# Patient Record
Sex: Male | Born: 1935 | Race: White | Hispanic: No | Marital: Married | Smoking: Former smoker
Health system: Southern US, Community
[De-identification: ages and names within clinical notes are randomized; demographics above are authoritative.]

## PROBLEM LIST (undated history)

## (undated) DIAGNOSIS — I495 Sick sinus syndrome: Secondary | ICD-10-CM

## (undated) DIAGNOSIS — F329 Major depressive disorder, single episode, unspecified: Secondary | ICD-10-CM

## (undated) DIAGNOSIS — F32A Depression, unspecified: Secondary | ICD-10-CM

## (undated) DIAGNOSIS — E0500 Thyrotoxicosis with diffuse goiter without thyrotoxic crisis or storm: Secondary | ICD-10-CM

## (undated) DIAGNOSIS — I4891 Unspecified atrial fibrillation: Secondary | ICD-10-CM

## (undated) DIAGNOSIS — F419 Anxiety disorder, unspecified: Secondary | ICD-10-CM

## (undated) DIAGNOSIS — I10 Essential (primary) hypertension: Secondary | ICD-10-CM

## (undated) DIAGNOSIS — M1990 Unspecified osteoarthritis, unspecified site: Secondary | ICD-10-CM

## (undated) HISTORY — DX: Sick sinus syndrome: I49.5

## (undated) HISTORY — DX: Major depressive disorder, single episode, unspecified: F32.9

## (undated) HISTORY — DX: Unspecified osteoarthritis, unspecified site: M19.90

## (undated) HISTORY — DX: Depression, unspecified: F32.A

## (undated) HISTORY — DX: Thyrotoxicosis with diffuse goiter without thyrotoxic crisis or storm: E05.00

## (undated) HISTORY — DX: Anxiety disorder, unspecified: F41.9

## (undated) HISTORY — DX: Gilbert syndrome: E80.4

---

## 2000-12-03 ENCOUNTER — Emergency Department (HOSPITAL_COMMUNITY): Admission: EM | Admit: 2000-12-03 | Discharge: 2000-12-04 | Payer: Self-pay | Admitting: Emergency Medicine

## 2000-12-03 ENCOUNTER — Encounter: Payer: Self-pay | Admitting: Emergency Medicine

## 2001-04-09 ENCOUNTER — Ambulatory Visit (HOSPITAL_COMMUNITY): Admission: RE | Admit: 2001-04-09 | Discharge: 2001-04-09 | Payer: Self-pay | Admitting: Ophthalmology

## 2002-06-30 ENCOUNTER — Ambulatory Visit (HOSPITAL_COMMUNITY): Admission: RE | Admit: 2002-06-30 | Discharge: 2002-06-30 | Payer: Self-pay | Admitting: Ophthalmology

## 2003-04-15 ENCOUNTER — Ambulatory Visit (HOSPITAL_COMMUNITY): Admission: RE | Admit: 2003-04-15 | Discharge: 2003-04-15 | Payer: Self-pay | Admitting: Gastroenterology

## 2005-03-31 ENCOUNTER — Ambulatory Visit (HOSPITAL_COMMUNITY): Admission: RE | Admit: 2005-03-31 | Discharge: 2005-03-31 | Payer: Self-pay | Admitting: Neurosurgery

## 2005-09-18 ENCOUNTER — Inpatient Hospital Stay (HOSPITAL_COMMUNITY): Admission: RE | Admit: 2005-09-18 | Discharge: 2005-09-21 | Payer: Self-pay | Admitting: Neurosurgery

## 2005-09-18 ENCOUNTER — Ambulatory Visit: Payer: Self-pay | Admitting: Physical Medicine & Rehabilitation

## 2005-09-21 ENCOUNTER — Inpatient Hospital Stay (HOSPITAL_COMMUNITY)
Admission: RE | Admit: 2005-09-21 | Discharge: 2005-09-26 | Payer: Self-pay | Admitting: Physical Medicine & Rehabilitation

## 2005-09-26 ENCOUNTER — Inpatient Hospital Stay (HOSPITAL_COMMUNITY): Admission: AD | Admit: 2005-09-26 | Discharge: 2005-09-29 | Payer: Self-pay | Admitting: Interventional Cardiology

## 2005-09-26 ENCOUNTER — Encounter (INDEPENDENT_AMBULATORY_CARE_PROVIDER_SITE_OTHER): Payer: Self-pay | Admitting: *Deleted

## 2005-09-29 ENCOUNTER — Inpatient Hospital Stay (HOSPITAL_COMMUNITY)
Admission: RE | Admit: 2005-09-29 | Discharge: 2005-10-06 | Payer: Self-pay | Admitting: Physical Medicine & Rehabilitation

## 2006-04-12 ENCOUNTER — Ambulatory Visit (HOSPITAL_COMMUNITY): Admission: RE | Admit: 2006-04-12 | Discharge: 2006-04-12 | Payer: Self-pay | Admitting: Neurosurgery

## 2006-04-17 ENCOUNTER — Encounter: Admission: RE | Admit: 2006-04-17 | Discharge: 2006-06-25 | Payer: Self-pay | Admitting: Neurosurgery

## 2006-06-26 ENCOUNTER — Encounter: Admission: RE | Admit: 2006-06-26 | Discharge: 2006-07-27 | Payer: Self-pay | Admitting: Neurosurgery

## 2008-05-21 ENCOUNTER — Emergency Department (HOSPITAL_COMMUNITY): Admission: EM | Admit: 2008-05-21 | Discharge: 2008-05-21 | Payer: Self-pay | Admitting: Emergency Medicine

## 2009-10-31 ENCOUNTER — Emergency Department (HOSPITAL_COMMUNITY): Admission: EM | Admit: 2009-10-31 | Discharge: 2009-10-31 | Payer: Self-pay | Admitting: Emergency Medicine

## 2009-11-02 ENCOUNTER — Inpatient Hospital Stay (HOSPITAL_COMMUNITY): Admission: AD | Admit: 2009-11-02 | Discharge: 2009-11-05 | Payer: Self-pay | Admitting: Orthopedic Surgery

## 2011-03-22 LAB — BASIC METABOLIC PANEL
CO2: 31 mEq/L (ref 19–32)
Calcium: 9.4 mg/dL (ref 8.4–10.5)
Creatinine, Ser: 0.78 mg/dL (ref 0.4–1.5)
GFR calc Af Amer: >60 mL/min (ref >60)
GFR calc non Af Amer: >60 mL/min (ref >60)
Glucose, Bld: 120 mg/dL — ABNORMAL HIGH (ref 70–99)
Potassium: 4.2 mEq/L (ref 3.5–5.1)
Sodium: 139 mEq/L (ref 135–145)

## 2011-03-22 LAB — APTT: aPTT: 38 seconds — ABNORMAL HIGH (ref 24–37)

## 2011-05-05 NOTE — Discharge Summary (Signed)
[name] Higgins, MINION NO.:  [id number]   MEDICAL RECORD NO.:  [id number]            PATIENT TYPE:   LOCATION:                                 FACILITY:   PHYSICIAN:  Ranelle Oyster, M.D.     DATE OF BIRTH:   DATE OF ADMISSION:  08/30/2005  DATE OF DISCHARGE:  10/06/2005                                 DISCHARGE SUMMARY   DISCHARGE DIAGNOSES:  1.  Lumbar stenosis L3-S1 with radiculopathy requiring decompression.  2.  Paroxysmal atrial fibrillation, resolved.  3.  Acute blood loss anemia.  Improved by transfusion.   HISTORY OF PRESENT ILLNESS:  Mr. [name] Higgins is an 77 year old male with a history  of myasthenia gravis, low back pain secondary to lumbar stenosis L3-S1 with  spondylosis and radiculopathy and decompressive of L3-L5 with arthrodesis of  L4-5 and L5-S1.  Repeat pedicle screws used, S1 and L4 Gill procedure  October 2, by Dr. Lovell Sheehan.  Postoperatively was noted to have weakness and  decrease in mobility.  He was transferred to rehab for CIR program on  October 5.  On October 10 the patient was noted to be in atrial fibrillation  with RBR.  He was transferred to telemetry by Dr. Garnette Scheuermann for monitoring  and workup.  He was started on IV __________ and IV heparin.  A 2-D echo was  done as part of the workup showing EF of 50-55% with mild AVR and mild focal  basal septal hypertrophy.  The patient reverted to normal sinus rhythm on  Betapace.  On increased doses of Betapace the patient's blood pressures were  noted to be low.  Therefore, dose was adjusted to 18 mg b.i.d. The patient  was maintained on IV heparin and aspirin.  However, he was noted to have  some drop in H&H with hemoglobin 8.9.  Aspirin and heparin discontinued.  As  the patient stabilized out from cardiac standpoint he was transferred back  to rehab to complete his TIR course.   PAST MEDICAL HISTORY:  Significant for myasthenia gravis with a history of  thymectomy, prostate cancer treated  with XRT, osteoporosis, history of rib  fractures and diverticulosis.   ALLERGIES:  To CODEINE.   SOCIAL HISTORY:  The patient is married, was independent and active prior to  admission.  He lives in a 1-level home with 3-steps at entry.  He does not  use any alcohol.  Quit tobacco in 1950s.   HOSPITAL COURSE:  [name] Higgins was readmitted to rehab on September 29, 2005 for inpatient therapies to continue of PT/OT therapy.  On past  admissions heart rate has been normal sinus rhythm on Betapace 80 mg b.i.d.  Blood pressures will be monitored on a b.i.d. basis and initially were noted  to be in the 90s range.  By the time of discharge the blood pressures were  stabilizing out.  In the last 24 hours blood pressures were ranging from 110-  117 systolics, 60-68 diastolic.  Heart rate has been ranging between the 60s  to 70s.  No complaints of  dizziness or lightheadedness reported.  The  patient's back pain has been managed with p.r.n. use of Tylenol.  Back  incision is noted to be healing well without any signs or symptoms of  infection, no drainage, no erythema noted.   Secondary to patient's anemia at admission as well as recent arrhythmia we  elected to go ahead and transfuse the patient with 2 units of packed red  blood cells.  We will check CBCs that we checked on October 14, and October  16 and noted them to be stable and in an upward trend.  Last check of H&H  shows hemoglobin at 11.4.  Hematocrit 33.4.  Check of electrolytes from  October 16 revealed sodium of 136, potassium 4.2, chloride 104, CO2 26, BUN  16, creatinine 0.8, and glucose 93.  LFT shows some slight elevation with  AST of 45 and ALT at 81.  The patient's endurance level has slowly steadily  improved.  He has made good progress during his stay.  By the time of  discharge he was modified independent for ADLs, modified independent for don  and doffing corset without difficulty, modified independence for simple home   management tasks.  He was modified independent for transfers, modified  independent for ambulating greater than 250 feet with a rolling walker.  He  continues with some balance deficits when not used assistive advise.  He is  aware of need for using walker for now.  Further followup therapies include  home health.  PT/OT by [facility] on October 06, 2005 the patient is  discharged to home.   DISCHARGE MEDICATIONS:  1.  Imuran 50 mg three p.o. per day.  2.  Prilosec 20 mg a day.  3.  Betapace 80 mg b.i.d. with stasis.  4.  Eye drops 1 drop OU daily.  5.  Fosamax every Thursday.   ACTIVITY LEVEL:  As tolerated with back precautions and use of walker.  No  driving for 4-6 weeks.  Lifting restrictions.   FOLLOWUP:  The patient to followup with Dr. Lovell Sheehan for postop check.  Followup with Dr. Garnette Scheuermann for recheck on atrial fibrillation in the next  few weeks.  Followup with Dr. Coral Spikes for routine check.      [name] Higgins, P.A.      Ranelle Oyster, M.D.  Electronically Signed    PP/MEDQ  D:  10/06/2005  T:  10/07/2005  Job:  [id number]   cc:   Cristi Loron, M.D.  Fax: [phone]   Garnette Scheuermann, M.D.   Demetria Pore. Coral Spikes, M.D.  Fax: [phone]

## 2011-05-05 NOTE — Discharge Summary (Signed)
NAMEJAVED, COTTO NO.:  [id number]   MEDICAL RECORD NO.:  [id number]          PATIENT TYPE:  INP   LOCATION:  3006                         FACILITY:  [facility]   PHYSICIAN:  Cristi Loron, M.D.DATE OF BIRTH:  Dec 12, 1921   DATE OF ADMISSION:  09/18/2005  DATE OF DISCHARGE:  09/21/2005                                 DISCHARGE SUMMARY   BRIEF HISTORY:  The patient is an 77 year old white male who has suffered  from back pain and with bilateral leg weakness.  He has a history of  myasthenia gravis and workup with a neurologist to include a lumbar MRI as  well as lower extremity NCV EMGs, which were consistent with a lumbar  radiculopathy.  The patient's MRI scan demonstrates lumbar degenerative disk  disease and stenosis.  I discussed the various treatment options with the  patient, including surgery.  The patient and his family weighed the risks  and benefits and alternatives of surgery and decided to proceed with a  lumbar decompression and fusion.   For further details of this admission, please refer to the history and  physical.   HOSPITAL COURSE:  I admitted the patient to [facility] on September 18, 2005, and the day of admission performed an L3 to L5 decompressive  laminectomy as well as a fusion.  The surgery well, and for full details of  this operation please refer to the typed operative note.   Postoperative course:  The patient's postoperative course was unremarkable.  By postop day #3, he was ready for transfer to the inpatient rehab unit and  was transferred to the inpatient rehab unit on September 21, 2005.   FINAL DIAGNOSIS:  L3-4, L4-5, L5-S1 spinal stenosis, lumbar radiculopathy,  lumbar spondylosis, facet arthropathy, lumbar spondylolisthesis, scoliosis.   PROCEDURE PERFORMED:  Decompressive laminectomy, L3, L4, and L5 to treat the  patient's spinal stenosis; L4-5 and L5-S1 posterolateral arthrodesis with  local morcellized autograft  bone and Vitoss bone graft extender; L4 to S1  bilateral segmental instrumentation with pedicle screws and rods; L4 Gill  procedure.   DISCHARGE INSTRUCTIONS:  The patient is instructed to follow up with me four  weeks after discharge from the rehab unit.      Cristi Loron, M.D.  Electronically Signed     JDJ/MEDQ  D:  11/02/2005  T:  11/03/2005  Job:  [id number]

## 2011-05-05 NOTE — Discharge Summary (Signed)
NAMEDIAR, BERKEL NO.:  [id number]   MEDICAL RECORD NO.:  [id number]           PATIENT TYPE:   LOCATION:                                 FACILITY:   PHYSICIAN:  Ranelle Oyster, M.D.     DATE OF BIRTH:   DATE OF ADMISSION:  08/22/2005  DATE OF DISCHARGE:  09/26/2005                                 DISCHARGE SUMMARY   DISCHARGE DIAGNOSES:  1.  New onset atrial fibrillation.  2.  Lumbar decompression for L3 to L5 spondylolisthesis stenosis.  3.  History of myasthenia gravis.  4.  Urinary retention resolving.   HISTORY OF PRESENT ILLNESS:  [name] Higgins is an 77 year old male with history  of myasthenia gravis, prostate cancer, lower extremity weakness with  difficulty ambulation secondary to L3-L4, L4-L5 spondylolisthesis with  stenosis.  Elected to undergo L3 to L5 laminectomy with L5-S1 PLITH by Dr.  Lovell Sheehan September 18, 2005.  Postoperatively has had problems with decreasing  mobility as well as problems with voiding, reporting problems with hesitancy  and incontinence.  Because of the impaired mobility, rehab was consulted for  further therapy.   PAST MEDICAL HISTORY:  See discharge diagnoses.  1.  Past history of thymectomy for myasthenia gravis.  2.  Prostate cancer treated with XRT.  3.  Right foot drop.  4.  Osteoporosis.  5.  DJD right shoulder.  6.  Macular dystrophy and excision of cataracts.   ALLERGIES:  CODEINE.   FAMILY HISTORY:  Positive for CVA and coronary artery disease.   SOCIAL HISTORY:  Patient is married, lives in one level home with three to  four steps at entry.  Was independent with __________ prior to admission and  decrease in mobility.  He quit tobacco 56 years ago.  Has only four-pack-  year history.  Does not use any alcohol.  Patient was independent in driving  prior to admission.   HOSPITAL COURSE:  Mr. [name] Higgins was admitted to rehab on September 21, 2005,  for inpatient therapies to consist of PT and OT daily.  Past  admission, PVRs  were checked to monitor voiding.  Labs done past admission revealed H&H of  10.7 and 31.1, white count 6.6, platelets 163.  Check of lytes revealed  sodium 138, potassium 3.8, chloride 104, CO2 30, BUN 16, creatinine 0.9,  glucose 128.  Patient was noted to  be voiding without difficulty with PVRs  at 0 to 35 mL.  On August 23, 2005, patient reported feelings of malaise  with decrease in energy levels.  He was noted to have a temperature of 101.1  that a.m. and he was pan cultured to include UA/UC including blood cultures  x2 and chest x-ray.  He was started on Cipro empirically for fever of  unknown origin.  Patient's chest x-ray done revealed no active disease.  UA/UC was noted to be negative.  Therefore, Cipro was discontinued on  September 25, 2005.  Patient was noted to be continent of bowel and bladder.  On September 26, 2005, on exam of patient, he was noted to  be tachycardic with  a heart rate from 120 to 130 range.  EKG was done showing question of atrial  fibrillation.  [facility] was consulted for input.  Full set of labs  were done including recheck CBC, a total H&H of 10.3 and 30.3, no  leukocytosis noted with white count at 5.0.  Check of lytes revealed sodium  139, potassium 4.4, chloride 108, CO2 25, BUN 16, creatinine 0.8, glucose  103.  LFTs were noted to be resolving with AST 30, ALT 47, AST 36.  Cardiac  enzymes done showed CK-MB of 3.7 with relative index at 2.9 and troponin I  0.03.  Dr. Katrinka Blazing evaluated patient and felt that patient's EKG revealed  atrial fibrillation with RVR and he hoped for spontaneous conversion.  He  would discuss use of heparin and Coumadin with neurosurgery prior to  initiating this.  Patient was transferred out to telemetry floor for further  work-up and treatment.  On September 26, 2005, [name] Higgins was discharged to  acute services, further adjustments in medications.  Diet and activity level  to be decided by [facility].      Greg Cutter, P.A.      Ranelle Oyster, M.D.  Electronically Signed    PP/MEDQ  D:  12/04/2005  T:  12/06/2005  Job:  [id number]

## 2011-05-05 NOTE — Consult Note (Signed)
NAMEGIOVANNIE, [name] Higgins NO.:  [id number]   MEDICAL RECORD NO.:  [id number]          PATIENT TYPE:  INP   LOCATION:  4729                         FACILITY:  [facility]   PHYSICIAN:  Lyn Records, M.D.   DATE OF BIRTH:  March 31, 1921   DATE OF CONSULTATION:  09/27/2005  DATE OF DISCHARGE:                                   CONSULTATION   REASON FOR CONSULTATION:  Atrial fibrillation with rapid ventricular  response.   CONCLUSION:  1.  Atrial fibrillation with rapid ventricular response.  2.  Recent lumbar disc surgery.  3.  Myasthenia gravis.      1.  Status post thyroidectomy.  4.  Prostate cancer.  5.  Osteoporosis.   RECOMMENDATIONS:  1.  IV Cardizem for rate control.  2.  Anticoagulation.  Will clear with neurosurgery if it is safe to use full      dose of antithrombotic therapy.  3.  Assume that patient has been in atrial fibrillation for less than 48      hours and therefore will probably be able to convert.  Will probably      attempt pharmacologic conversion.  4.  Check cardiac markers and 2-D echocardiogram.   CLINICAL DATA:  The patient is 77 years of age and has multiple medical  problems.  He underwent lumbar laminectomy L3-L5 for spinal stenosis on  September 18, 2005.  He was noted on rounds on September 26, 2005, to have a  rapid heart rate and EKG revealed atrial fibrillation with rapid ventricular  response. The patient was totally devoid of any symptoms related to this.  He denies orthopnea, PND and chest pain.  No prior history of cardiac  problems.  No of atrial fibrillation.   CURRENT MEDICAL REGIMEN:  1.  Darvocet.  2.  Imuran.  3.  Fosamax.  4.  Mirtazapine.   ALLERGIES:  CODEINE.   PHYSICAL EXAMINATION:  GENERAL APPEARANCE:  Patient is in no distress.  He  is lying flat in bed.  VITAL SIGNS:  Blood pressure 104/60, heart rate 140.  EKG reveals atrial  fibrillation with rapid ventricular response, no acute STT wave changes  noted.  NECK:   Neck veins are not distended.  LUNGS:  Clear.  CARDIOVASCULAR:  Rapid irregular rhythm.  ABDOMEN:  Soft.  EXTREMITIES:  No edema.   LABORATORY DATA:  Potassium 3.8, BUN 14, creatinine 0.9.  Hemoglobin greater  than 10.   Chest x-ray on admission was unremarkable.   DISCUSSION:  The patient has probably been in atrial fibrillation for less  than 48 hours.  He has been on subcu Lovenox.  I am uncertain if we can use  full dose anticoagulation.  Will discuss with neurosurgery.  For the time  being, will go with rate control with IV Cardizem.  Will ultimately try  pharmacologic conversion perhaps with Sotalol.      Lyn Records, M.D.  Electronically Signed     HWS/MEDQ  D:  09/27/2005  T:  09/27/2005  Job:  [id number]   cc:   Demetria Pore. Coral Spikes, M.D.  Fax: [phone]   Ranelle Oyster, M.D.  Fax: [phone]   Cristi Loron, M.D.  Fax: [phone]

## 2011-05-05 NOTE — Discharge Summary (Signed)
NAMEQUINTAVIOUS, RINCK NO.:  [id number]   MEDICAL RECORD NO.:  [id number]          PATIENT TYPE:  INP   LOCATION:  3006                         FACILITY:  [facility]   PHYSICIAN:  Cristi Loron, M.D.DATE OF BIRTH:  10/02/1921   DATE OF ADMISSION:  09/18/2005  DATE OF DISCHARGE:  09/21/2005                                 DISCHARGE SUMMARY   BRIEF HISTORY:  Dictation ended at this point.      Cristi Loron, M.D.  Electronically Signed     JDJ/MEDQ  D:  11/02/2005  T:  11/03/2005  Job:  [id number]

## 2011-05-05 NOTE — Op Note (Signed)
[name] Higgins, [name] Higgins                 ACCOUNT NO.:  [id number]   MEDICAL RECORD NO.:  [id number]          PATIENT TYPE:  INP   LOCATION:  2860                         FACILITY:  [facility]   PHYSICIAN:  Cristi Loron, M.D.DATE OF BIRTH:  Jun 02, 1921   DATE OF PROCEDURE:  09/18/2005  DATE OF DISCHARGE:                                 OPERATIVE REPORT   PREOPERATIVE DIAGNOSIS:  L3-4, 4-5, 5-1 spinal stenosis, lumbar  radiculopathy, lumbar spondylosis, facet arthropathy, lumbar  spondylolisthesis and scoliosis.   POSTOPERATIVE DIAGNOSIS:  L3-4, 4-5, 5-1 spinal stenosis, lumbar  radiculopathy, lumbar spondylosis, facet arthropathy, lumbar  spondylolisthesis and scoliosis.   OPERATION PERFORMED:  Decompressive laminectomy at L3, 4, and 5 to treat the  patient's spinal stenosis; L4-5 and L5-S1 posterolateral arthrodesis with  local morcellized autograft bone and Vitoss bone graft extender; L4 to S1  bilateral segmental instrumentation with pedicle screws and rods.  L4 Gill procedure.   SURGEON:  Cristi Loron, M.D.   ASSISTANT:  Hilda Lias, M.D.   ANESTHESIA:  General endotracheal.   ESTIMATED BLOOD LOSS:  300 mL.   SPECIMENS:  None.   DRAINS:  None.   INDICATIONS FOR PROCEDURE:  The patient is an 77 year old white male who has  suffered from back pain and bilateral leg weakness.  He has history of  myasthenia gravis and was worked up by his neurologist to include a lumbar  MRI as well as a lower extremity Renovo EMGs which were consistent with a lumbar  radiculopathy and the MR scan demonstrated lumbar degenerative disk disease  and stenosis.  I discussed the various treatment options with him including  surgery.  The patient and his family have weighed the risks, benefits and  alternatives to surgery and decided to proceed with lumbar decompression and  fusion.   DESCRIPTION OF PROCEDURE:  The patient was brought to the operating room by  the anesthesia team.  General  endotracheal anesthesia was induced.  The  patient was then carefully turned to the prone position on the Wilson frame.  His lumbosacral region was then prepared with Betadine scrub and Betadine  solution and sterile drapes were applied.  I then injected the area to be  incised with Marcaine with epinephrine solution.  I used a scalpel to make a  midline incision over the L3-4, 4-5 and 5-1 interspaces.  I used  electrocautery to perform a bilateral subperiosteal dissection exposing the  spinous process and lamina of L3, 4, 5 and the upper sacrum.  We obtained  intraoperative radiograph to confirm our location.  We then inserted the  Versatrac retractor for exposure.  We used the scalpel to incise the L3-4, 4-  5 and 5-1 interspinous ligament.  We used Leksell rongeur to remove the L4  and L5 spinous processes.  We saved this bone and later cleared it of soft  tissue, morcellized it and used it in the fusion process.  We used a high  speed drill to perform bilateral laminotomies at L5, L4 and L3.  We then  completed the L4 Gill procedure using the Kerrison punch  to remove the  remainder of the L4 lamina and pars and removed the L4-5 ligamentum flavum.  We also used the Kerrison punch to complete the laminectomy at L5 as well as  to widen the laminotomies bilaterally at L3.  We removed the ligamentum  flavum at L3-4, 4-5 and 5-1.  We performed a generous foraminotomy about the  bilateral 4, 5, and S1 nerve roots.  In the process of doing the  decompression, we encountered a midline dorsal durotomy.  We closed this  with a running 6-0 Prolene suture.  We then had anesthesia Valsalva the  patient and there was a good watertight closure.  At this point we had  completed the decompression and of note there was considerable facet  arthropathy, worse on the right than the left but we had a good  decompression of the thecal sac and the bilateral L4, 5, and S1 nerve roots.  We now turned our  attention to the posterior segmental instrumentation.  Under fluoroscopic guidance, we cannulated the bilateral L4, 5, and S1  pedicles with the bone probe. We tapped the pedicles with 5.5 mm tap.  We  probed inside the tapped pedicles to rule out cortical breeches.  We then  inserted a combination of 6.5 x 40, 45 and 50 mm pedicle screws bilaterally  at L4, 5, and S1.  We did this under fluoroscopic guidance.  After we placed  the pedicle screws, we palpated along the medial aspect of the bilateral L4,  5, and S1 pedicles and noted there were no cortical breeches and the  bilateral L4, 5, and S1 nerve roots were not injured.  We then connected the  unilateral pedicle screws with appropriate length rod.  We fastened the rod  by placing the caps which were tightened appropriately and this ended the  instrumentation.   We now turned our attention to posterolateral arthrodesis.  We used high  speed drill to decorticate the remainder of the L4-5 and 5-1 facet joints  and the bilateral L4-5 pars region and the upper sacrum.  We then laid a  combination of local morcellized autograft bone and VITOSS bone graft  extender over these decorticated posterolateral structures completing the  posterolateral arthrodesis.   We then obtained stringent hemostasis using bipolar electrocautery.  We once  again palpated along the ventral surface of the thecal sac and noted it to  be well decompressed.  We did use high speed drill to remove some of the  posterior spondylosis at L4-5 on the right which was causing some ventral  compression of the thecal sac, further decompressing the thecal sac.  We  then copiously irrigated the wound out with bacitracin solution.  We then  placed a seal over the durotomy and then we removed the retractor,  reapproximating the patient's thoracolumbar fascia with interrupted #1 Vicryl suture, the subcutaneous tissue with interrupted 2-0 Vicryl suture  and the skin with  Steri-Strips and benzoin. The wound was then coated with  bacitracin ointment, sterile dressing was applied, the drapes were removed.  The patient was subsequently returned to supine position where he was  extubated by the anesthesia team and transported to the post anesthesia care  unit in stable condition.  All sponge, needle and instrument counts were  correct at the end of the case.      Cristi Loron, M.D.  Electronically Signed     JDJ/MEDQ  D:  09/18/2005  T:  09/19/2005  Job:  [id number]

## 2011-05-05 NOTE — Op Note (Signed)
   [name], [name]                           ACCOUNT NO.:  [id number]   MEDICAL RECORD NO.:  [id number]                   PATIENT TYPE:  AMB   LOCATION:  ENDO                                 [facility]ACILITY[facility]   PHYSICIAN:  Danise Edge, M.D.                DATE OF BIRTH:  1921-05-30   DATE OF PROCEDURE:  04/15/2003  DATE OF DISCHARGE:                                 OPERATIVE REPORT   REFERRING PHYSICIAN:  Demetria Pore. Coral Spikes, M.D.   PROCEDURE PERFORMED:  Colonoscopy.   ENDOSCOPIST:  Charolett Bumpers, M.D.   INDICATIONS FOR PROCEDURE:  The patient is a 77 year old male born Jan 21, 1921.  The patient is scheduled to undergo his first screening  colonoscopy with polypectomy to prevent colon cancer.   PREMEDICATION:  Versed 3 mg, Demerol 50 mg.   DESCRIPTION OF PROCEDURE:  After obtaining informed consent, the patient was  placed in the left lateral decubitus position.  I administered intravenous  Demerol and intravenous Versed to achieve conscious sedation for the  procedure.  The patient's blood pressure, oxygen saturations and cardiac  rhythm were monitored throughout the procedure and documented in the medical  record.   Anal inspection was normal.  Digital rectal exam was normal.  The prostate  was nonnodular.  The pediatric Olympus colonoscope was introduced into the  rectum and easily advanced to the cecum.  Colonic preparation for the exam  today was excellent.   Rectum:  Normal.   Sigmoid colon and descending colon:  Left colonic diverticulosis.   Splenic flexure:  Normal.   Transverse colon:  Normal.   Hepatic flexure:  Normal.   Ascending colon:  Normal.   Cecum and ileocecal valve:  Normal.    ASSESSMENT:  Normal screening proctocolonoscopy to the cecum.  No endoscopic  evidence for the presence of colorectal neoplasia.                                                   Danise Edge, M.D.    MJ/MEDQ  D:  04/15/2003  T:  04/15/2003  Job:   [id number]   cc:   Demetria Pore. Coral Spikes, M.D.  [address]  [address]  [place]  [place] [address]  Fax: [phone]

## 2011-05-05 NOTE — Discharge Summary (Signed)
[name] Higgins, GOSSE NO.:  [id number]   MEDICAL RECORD NO.:  [id number]          PATIENT TYPE:  INP   LOCATION:  4729                         FACILITY:  [facility]   PHYSICIAN:  Lyn Records, M.D.   DATE OF BIRTH:  [birth year]   DATE OF ADMISSION:  09/26/2005  DATE OF DISCHARGE:  09/29/2005                                 DISCHARGE SUMMARY   CHIEF COMPLAINT AND REASON FOR ADMISSION:  [name] Higgins is an 77 year old male  patient with multiple medical problems.  He was admitted to the hospital on  September 18, 2005 for lumbar laminectomy L3-5 spinal stenosis.  On routine  rounds on September 26, 2005 the was found to have rapid heart rate and EKG  revealed atrial fibrillation with rapid ventricular response.  The patient  was asymptomatic with this rhythm and he has no prior history of cardiac  disease or atrial fibrillation.  The patient has been in atrial fibrillation  for less than 48 hours.  He has been on subcutaneous Lovenox with plans per  Dr. Katrinka Blazing to discuss with neurosurgery if this can be bumped up to full dose  Lovenox for anticoagulation for atrial fibrillation.  Initial plans are to  start with IV rate-control with Cardizem and possible pharmacological  cardioversion with Sotalol if okay with neurosurgery postoperatively.   ADMISSION DIAGNOSES:  1.  Atrial fibrillation with rapid ventricular response.  2.  Recent lumbar disc surgery.  3.  Myasthenia gravis.  4.  Prostate cancer.  5.  Osteoporosis.   HOSPITAL COURSE:  Problem 1. Atrial fibrillation with rapid ventricular  response.  The patient started on IV Cardizem with subsequent good control  of ventricular rate.  Cardiac markers were negative.  Hemoglobin stable at  10.  Potassium stable at 4.4.  LFT's were mildly elevated with an ALT of 47  and the alkaline phosphatase was low at 36.  Because the patient's  ventricular rate was beginning to come down Dr. Katrinka Blazing planned on September 27, 2005 to wean and  discontinue the Cardizem and initiate Sotalol once the  heart rate was less than 70.  Plans were to send the patient back to the  rehab unit where he was initially evaluated once Sotalol load was complete.  There was no need for any additional cardiac workup such as an ischemic  workup.   In the afternoon of September 27, 2005 the patient had a typical chest pain  not associated with arrhythmia or EKG changes.  Enzymes were repeated and  these were normal.  Blood pressure was slightly low at 90/60 the following  day.  With the initiation of Sotalol the patient had subsequently converted  to sinus rhythm with PAC's.  He was deemed appropriate to return back to  rehab medicine with the rehab medicine doctor assuming care of the patient.  Dr. Katrinka Blazing wanted to continue aspirin and subcutaneous Lovenox for DVT  prophylaxis as before with discontinue of IV heparin and no need for chronic  Coumadin anticoagulation at this point.  Betapace was decreased to 80 mg  b.i.d.  FINAL DISCHARGE DIAGNOSES:  1.  Atrial fibrillation with rapid ventricular response.  2.  Subsequent conversion to sinus rhythm with PAC's after initiation of      Sotalol therapy.  3.  Short duration of atrial fibrillation, no need for chronic      anticoagulation.  4.  Recent laminectomy surgery.  Okay to return to rehab.   DISCHARGE MEDICATIONS AND PLANS:  Continue as previously noted.  Will  continue the Betapace at 80 mg b.i.d.  Lovenox back to subcu dosing.  Discontinue heparin.  Other medications and management plans per rehab  physician.      Allison L. Rennis Harding, N.P.      Lyn Records, M.D.  Electronically Signed    ALE/MEDQ  D:  11/27/2005  T:  11/28/2005  Job:  [id number]

## 2013-10-15 ENCOUNTER — Encounter: Payer: Self-pay | Admitting: Podiatry

## 2013-10-15 ENCOUNTER — Ambulatory Visit (INDEPENDENT_AMBULATORY_CARE_PROVIDER_SITE_OTHER): Payer: Medicare Other | Admitting: Podiatry

## 2013-10-15 VITALS — BP 138/82 | HR 67 | Resp 16 | Ht 67.5 in | Wt 159.0 lb

## 2013-10-15 DIAGNOSIS — B351 Tinea unguium: Secondary | ICD-10-CM

## 2013-10-15 DIAGNOSIS — M79609 Pain in unspecified limb: Secondary | ICD-10-CM

## 2013-10-15 NOTE — Progress Notes (Signed)
Patient ID: [name] Higgins, male   DOB: 04/26/1921, 79 y.o.   MRN: [id number]  Subjective: This alert orientated x41 79 year old white male presents for ongoing debridement of painful mycotic toenails.  Objective: Hypertrophic elongated toenails are texture and color changes x10  Assessment: Symptomatic onychomycoses x10  Plan: All 10 toenails are debrided back today. Small pinpoint bleeding noted on the left hallux, which was painted with Betadine and a dressing applied. Patient advised to remove the bandage in the next 48 hours and apply topical antibiotic ointment if needed.  Reappoint in 3 month intervals  Richard C.Leeanne Deed, DPM

## 2013-10-15 NOTE — Patient Instructions (Signed)
Remove bandage on the left great toe within 48 hours. You could continue to apply topical antibiotic ointment if needed to the left great toe. Applied all-purpose skin lotion to feet and legs on a daily basis.

## 2013-10-21 ENCOUNTER — Encounter: Payer: Self-pay | Admitting: Podiatry

## 2013-10-21 ENCOUNTER — Ambulatory Visit (INDEPENDENT_AMBULATORY_CARE_PROVIDER_SITE_OTHER): Payer: Medicare Other | Admitting: Podiatry

## 2013-10-21 VITALS — BP 169/69 | HR 86 | Resp 12

## 2013-10-21 DIAGNOSIS — M79609 Pain in unspecified limb: Secondary | ICD-10-CM

## 2013-10-21 NOTE — Progress Notes (Signed)
[name] Higgins presents today questioning whether or not he has an infection to the hallux left. He states that Dr. Cristela Felt at the end of his toe the other day and he sees a red spot weeks this is concerned about it.  Objective: Vital signs are stable he is alert and oriented x3 there is no erythema edema cellulitis drainage or odor to the toes hallux left no signs of infection.  Assessment: no infection hallux left.  Plan: Soak in Epsom salts in warm water for the next few days followup with should he notice any redness or drainage.

## 2013-10-27 ENCOUNTER — Telehealth: Payer: Self-pay | Admitting: *Deleted

## 2013-10-27 NOTE — Telephone Encounter (Signed)
Pt states took the dressing off a little earlier than 48 hrs and now it feels funny.  Pt was unable to describe the sensation, but was scared.  I told him to continue the care Dr Al Corpus had ordered and I would like him to see Dr Al Corpus on 10/28/2013.  I referred him to a scheduler.

## 2013-10-28 ENCOUNTER — Encounter: Payer: Self-pay | Admitting: Podiatry

## 2013-10-28 ENCOUNTER — Ambulatory Visit (INDEPENDENT_AMBULATORY_CARE_PROVIDER_SITE_OTHER): Payer: Medicare Other | Admitting: Podiatry

## 2013-10-28 VITALS — BP 137/79 | HR 74 | Resp 16

## 2013-10-28 DIAGNOSIS — L02619 Cutaneous abscess of unspecified foot: Secondary | ICD-10-CM

## 2013-10-28 NOTE — Progress Notes (Signed)
[name] Higgins presents today for followup of a painful hallux nail left  foot. He denies fever chills nausea vomiting muscle aches or pains.  Objective: Pulses are palpable to the left foot. Hallux nail is thick yellow dystrophic clinically mycotic does not demonstrate any signs of bacterial infection in the surrounding soft tissue.  Assessment: Resolving infection hallux left.  Plan: Followup for routine nail debridement.

## 2013-11-24 ENCOUNTER — Ambulatory Visit: Payer: Medicare Other | Admitting: Podiatry

## 2014-01-19 ENCOUNTER — Ambulatory Visit: Payer: 59 | Admitting: Podiatry

## 2014-02-02 ENCOUNTER — Ambulatory Visit: Payer: 59 | Admitting: Podiatry

## 2014-02-16 ENCOUNTER — Ambulatory Visit (INDEPENDENT_AMBULATORY_CARE_PROVIDER_SITE_OTHER): Payer: Medicare Other | Admitting: Podiatry

## 2014-02-16 ENCOUNTER — Encounter: Payer: Self-pay | Admitting: Podiatry

## 2014-02-16 VITALS — BP 122/66 | HR 79 | Resp 18

## 2014-02-16 DIAGNOSIS — M79609 Pain in unspecified limb: Secondary | ICD-10-CM

## 2014-02-16 DIAGNOSIS — B351 Tinea unguium: Secondary | ICD-10-CM

## 2014-02-16 NOTE — Patient Instructions (Signed)
Leave bandages on first and fourth toes on the right foot x24 hours. Apply triple antibiotic ointment to the toenails 1 and 4 right daily x5 days.

## 2014-02-17 ENCOUNTER — Telehealth: Payer: Self-pay | Admitting: *Deleted

## 2014-02-17 NOTE — Telephone Encounter (Signed)
Pt states his paperwork tells him to take the dressings off his toenail after 24 hours and put triple antibiotic ointment on them for 5 days.  Pt states he can take it off but doesn't have triple antibiotic ointment only and antibiotic ointment, and where does he put it.  I told him on the skin around the toes.  Pt states understanding.

## 2014-02-17 NOTE — Progress Notes (Signed)
Patient ID: [name] Higgins, male   DOB: 17-Sep-1921, 80 y.o.   MRN: [id number]  Subjective: This patient presents for ongoing debridement of painful mycotic toenails.  Objective: Elongated, discolored, hypertrophic, brittle toenails x10.  Assessment: Symptomatic onychomycoses x10  Plan: All 10 toenails were debrided. Small bleeding noted distal worse and fourth right toes. Betadine and gauze dressings applied to both sites. Patient advised to continue applied topical antibiotic ointment to these areas.  Reappoint at three-month intervals or sooner if patient has concern.

## 2014-02-20 ENCOUNTER — Ambulatory Visit: Payer: Medicare Other | Admitting: *Deleted

## 2014-02-20 VITALS — BP 149/95 | HR 74 | Resp 17

## 2014-02-20 DIAGNOSIS — M79606 Pain in leg, unspecified: Secondary | ICD-10-CM

## 2014-02-20 NOTE — Progress Notes (Signed)
Pt present to office after calling to report he thought his toe was swollen after being trimmed on 02/16/2014 by Dr. Leeanne Deeduchman.  I removed pt's right shoe and brace, to view right hallux and toenail borders.  Pt states it does not hurt and has had no drainage.  The right hallux is not swollen, red nor does it have any drainage.  I applied Triple Antibiotic Ointment to the right hallux and instructed pt to dress the area the same way for 7 to 10 days.  Pt states he will do the dressing changes as directed.

## 2014-02-23 ENCOUNTER — Ambulatory Visit: Payer: Medicare Other | Admitting: Podiatry

## 2014-05-18 ENCOUNTER — Encounter: Payer: Self-pay | Admitting: Podiatry

## 2014-05-18 ENCOUNTER — Ambulatory Visit (INDEPENDENT_AMBULATORY_CARE_PROVIDER_SITE_OTHER): Payer: Medicare Other | Admitting: Podiatry

## 2014-05-18 VITALS — BP 135/88 | HR 96 | Resp 18

## 2014-05-18 DIAGNOSIS — M79609 Pain in unspecified limb: Secondary | ICD-10-CM

## 2014-05-18 DIAGNOSIS — B351 Tinea unguium: Secondary | ICD-10-CM

## 2014-05-19 NOTE — Progress Notes (Signed)
Patient ID: [name] Higgins, male   DOB: 10-16-1921, 80 y.o.   MRN: [id number]  Subjective: White male presents requesting debridement of toenails. He is very fearful of having a bleeding as a result of the debridement.  Objective: All toenails are brittle, hypertrophic, incurvated, discolored and tender to palpation  Assessment: Symptomatic onychomycoses x10   Plan: All 10 toenails were debrided mechanically without any bleeding.  Reappoint at three-month intervals

## 2014-08-10 ENCOUNTER — Ambulatory Visit: Payer: Medicare Other | Admitting: Podiatry

## 2014-08-17 ENCOUNTER — Ambulatory Visit (INDEPENDENT_AMBULATORY_CARE_PROVIDER_SITE_OTHER): Payer: Medicare Other | Admitting: Podiatry

## 2014-08-17 DIAGNOSIS — M79676 Pain in unspecified toe(s): Secondary | ICD-10-CM

## 2014-08-17 DIAGNOSIS — B351 Tinea unguium: Secondary | ICD-10-CM

## 2014-08-17 DIAGNOSIS — M79609 Pain in unspecified limb: Secondary | ICD-10-CM

## 2014-08-17 NOTE — Progress Notes (Signed)
Patient ID: [name] Higgins, male   DOB: 1921-08-11, 80 y.o.   MRN: [id number]  Subjective: This patient presents complaining of painful toenails  Objective: Discolored, hypertrophic, incurvated toenails 6-10  Assessment: Symptomatic onychomycoses 6-10  Plan: Nails x10 debrided without a bleeding  Reappoint at three-month intervals

## 2014-09-15 ENCOUNTER — Encounter (HOSPITAL_COMMUNITY): Payer: Self-pay | Admitting: Emergency Medicine

## 2014-09-15 ENCOUNTER — Emergency Department (HOSPITAL_COMMUNITY)
Admission: EM | Admit: 2014-09-15 | Discharge: 2014-09-15 | Disposition: A | Discharge status: Home / Self Care | Payer: Medicare Other | Attending: Emergency Medicine | Admitting: Emergency Medicine

## 2014-09-15 ENCOUNTER — Emergency Department (HOSPITAL_COMMUNITY): Payer: Medicare Other

## 2014-09-15 DIAGNOSIS — Z87891 Personal history of nicotine dependence: Secondary | ICD-10-CM | POA: Diagnosis not present

## 2014-09-15 DIAGNOSIS — S51009A Unspecified open wound of unspecified elbow, initial encounter: Secondary | ICD-10-CM | POA: Insufficient documentation

## 2014-09-15 DIAGNOSIS — Z7982 Long term (current) use of aspirin: Secondary | ICD-10-CM | POA: Insufficient documentation

## 2014-09-15 DIAGNOSIS — Y9389 Activity, other specified: Secondary | ICD-10-CM | POA: Diagnosis not present

## 2014-09-15 DIAGNOSIS — W010XXA Fall on same level from slipping, tripping and stumbling without subsequent striking against object, initial encounter: Secondary | ICD-10-CM | POA: Insufficient documentation

## 2014-09-15 DIAGNOSIS — Z862 Personal history of diseases of the blood and blood-forming organs and certain disorders involving the immune mechanism: Secondary | ICD-10-CM | POA: Diagnosis not present

## 2014-09-15 DIAGNOSIS — S92309A Fracture of unspecified metatarsal bone(s), unspecified foot, initial encounter for closed fracture: Secondary | ICD-10-CM | POA: Diagnosis not present

## 2014-09-15 DIAGNOSIS — M129 Arthropathy, unspecified: Secondary | ICD-10-CM | POA: Insufficient documentation

## 2014-09-15 DIAGNOSIS — S065X9A Traumatic subdural hemorrhage with loss of consciousness of unspecified duration, initial encounter: Secondary | ICD-10-CM | POA: Diagnosis not present

## 2014-09-15 DIAGNOSIS — S0990XA Unspecified injury of head, initial encounter: Secondary | ICD-10-CM | POA: Diagnosis present

## 2014-09-15 DIAGNOSIS — Y9289 Other specified places as the place of occurrence of the external cause: Secondary | ICD-10-CM | POA: Diagnosis not present

## 2014-09-15 DIAGNOSIS — Z23 Encounter for immunization: Secondary | ICD-10-CM | POA: Diagnosis not present

## 2014-09-15 DIAGNOSIS — Z8639 Personal history of other endocrine, nutritional and metabolic disease: Secondary | ICD-10-CM | POA: Diagnosis not present

## 2014-09-15 DIAGNOSIS — S065XAA Traumatic subdural hemorrhage with loss of consciousness status unknown, initial encounter: Secondary | ICD-10-CM | POA: Diagnosis not present

## 2014-09-15 DIAGNOSIS — I10 Essential (primary) hypertension: Secondary | ICD-10-CM | POA: Insufficient documentation

## 2014-09-15 DIAGNOSIS — S92301A Fracture of unspecified metatarsal bone(s), right foot, initial encounter for closed fracture: Secondary | ICD-10-CM

## 2014-09-15 DIAGNOSIS — W19XXXA Unspecified fall, initial encounter: Secondary | ICD-10-CM

## 2014-09-15 DIAGNOSIS — Z79899 Other long term (current) drug therapy: Secondary | ICD-10-CM | POA: Diagnosis not present

## 2014-09-15 HISTORY — DX: Essential (primary) hypertension: I10

## 2014-09-15 HISTORY — DX: Unspecified atrial fibrillation: I48.91

## 2014-09-15 LAB — CBC
HEMATOCRIT: 32.3 % — AB (ref 39.0–52.0)
HEMOGLOBIN: 11 g/dL — AB (ref 13.0–17.0)
MCH: 33.8 pg (ref 26.0–34.0)
MCHC: 34.1 g/dL (ref 30.0–36.0)
MCV: 99.4 fL (ref 78.0–100.0)
Platelets: 151 10*3/uL (ref 150–400)
RBC: 3.25 MIL/uL — AB (ref 4.22–5.81)
RDW: 16.3 % — AB (ref 11.5–15.5)
WBC: 6 10*3/uL (ref 4.0–10.5)

## 2014-09-15 LAB — BASIC METABOLIC PANEL
Anion gap: 14 (ref 5–15)
BUN: 21 mg/dL (ref 6–23)
CO2: 25 mEq/L (ref 19–32)
Calcium: 9.6 mg/dL (ref 8.4–10.5)
Chloride: 95 mEq/L — ABNORMAL LOW (ref 96–112)
Creatinine, Ser: 0.77 mg/dL (ref 0.50–1.35)
GFR calc Af Amer: 90 mL/min — ABNORMAL LOW (ref >90)
GFR calc non Af Amer: 78 mL/min — ABNORMAL LOW (ref >90)
Glucose, Bld: 102 mg/dL — ABNORMAL HIGH (ref 70–99)
Potassium: 4.1 mEq/L (ref 3.7–5.3)
Sodium: 134 mEq/L — ABNORMAL LOW (ref 137–147)

## 2014-09-15 LAB — URINALYSIS, ROUTINE W REFLEX MICROSCOPIC
Bilirubin Urine: NEGATIVE
GLUCOSE, UA: NEGATIVE mg/dL
Ketones, ur: 15 mg/dL — AB
LEUKOCYTES UA: NEGATIVE
NITRITE: NEGATIVE
PH: 5.5 (ref 5.0–8.0)
PROTEIN: 30 mg/dL — AB
Specific Gravity, Urine: 1.017 (ref 1.005–1.030)
UROBILINOGEN UA: 1 mg/dL (ref 0.0–1.0)

## 2014-09-15 LAB — URINE MICROSCOPIC-ADD ON

## 2014-09-15 LAB — I-STAT TROPONIN, ED: Troponin i, poc: 0.06 ng/mL (ref 0.00–0.08)

## 2014-09-15 MED ORDER — TETANUS-DIPHTH-ACELL PERTUSSIS 5-2.5-18.5 LF-MCG/0.5 IM SUSP
0.5000 mL | Freq: Once | INTRAMUSCULAR | Status: AC
  Administered 2014-09-15: 0.5 mL via INTRAMUSCULAR
  Filled 2014-09-15: qty 0.5

## 2014-09-15 NOTE — ED Notes (Signed)
SW at bedside to discuss resources with patient.

## 2014-09-15 NOTE — ED Notes (Signed)
Pt and family sent home with resources per SW. Family has home care set up. No other questions/concerns. Pt sent home with all belongings and bilateral lower leg braces. Assisted patient in wheelchair to car. Daughter driving. No other questions/concerns. Pt neurologically intact. AVS explained in detail.

## 2014-09-15 NOTE — Progress Notes (Addendum)
  CARE MANAGEMENT ED NOTE 09/15/2014  Patient:  [name] Higgins,[name]   Account Number:  [id number]  Date Initiated:  09/15/2014  Documentation initiated by:  Edd ArbourGIBBS,Leather Estis  Subjective/Objective Assessment:   80 yr old medicare/[employer] generic [place] resident from home fell in the middle of the night and laid on the floor for several hours prior to requesting help. Pt refused transport this morning. Pt fell out of bed this morning when     Subjective/Objective Assessment Detail:   he slipped while attempting to dress himself. Skin tear to right elbow, pt states that right little toe was bent out of place, states he reduced the joint himself.  pcp [place]    son in law in pt room calling providers from Private duty nursing (PDN) list to obtain someone to stay with pt this evening He called [facility]  Daughter states heir concern was pt being alone in his home at night, especially "tonight" She lives an hour away, left work to visit at ED and unable to stay the night with pt "we have been discussing facilities"  States her brother from [place] will be visiting soon She and pt states they will re discuss facility placement     Action/Plan:   CM consult for home health services Spoke with EDP, Jonette MateWalden Spoke with pt, daughter, and son in law at bedside see note below Pending choice of home health agency, orders for EDP   Action/Plan Detail:   Anticipated DC Date:  09/15/2014     Status Recommendation to Physician:   Result of Recommendation:    Other ED Services  Consult Working Plan    DC Planning Services  Other  Outpatient Services - Pt will follow up   [facility]  HOME HEALTH   Choice offered to / List presented to:  C-1 Patient          Status of service:  Completed, signed off  ED Comments:   ED Comments Detail:   09/15/14 1708 Cm received fax confirmation of clinicals sent to [facility] after 4 attempts [facility] and provided other  information needed 1554 Choice of home health agency is [facility] Cm faxed clinicals to [facility] at [phone] Spoke with Burna MortimerWanda at [facility] for verbal referral Burna MortimerWanda stated pt can be provided services if Medicare is primary coverage not [employer]   CM reviewed in details medicare guidelines for admission, home health [facility](HH) (length of stay in home, [facility] available, coverage, primary caregiver, up to 24 hrs before services may be started), Private duty nursing (PDN-coverage, length of stay in the home types of staff available), assisted living (ASL- coverage, services offered) and Skilled nursing facilities (snf- coverage and services offered)  CM reviewed availability of HH SW to assist pcp to get pt to snf (if desired disposition) from the community level. CM provided family with a list of [place] home health agencies, PDN, ALF and snfs.

## 2014-09-15 NOTE — ED Notes (Signed)
MD at bedside to discuss plan of care and discharge information.

## 2014-09-15 NOTE — ED Notes (Signed)
Bed: WA05 Expected date:  Expected time:  Means of arrival:  Comments: 

## 2014-09-15 NOTE — ED Provider Notes (Signed)
CSN: [id number]     Arrival date & time 09/15/14  1026 History   First MD Initiated Contact with Patient 09/15/14 1030     Chief Complaint  Patient presents with  . Fall     (Consider location/radiation/quality/duration/timing/severity/associated sxs/prior Treatment) HPI Comments: States he slipped on the carpet twice last night, once when getting up from the bed, once when putting on a shirt. No preceding symptoms. No N/V/D. No LOC. Hit his head on an unknown object (he thinks the mattress) on the way down from the first fall.  Patient is a 80 y.o. male presenting with fall. The history is provided by the patient.  Fall This is a new problem. The current episode started 6 to 12 hours ago. Episode frequency: twice. The problem has been resolved. Pertinent negatives include no chest pain, no abdominal pain and no shortness of breath. Nothing aggravates the symptoms. Nothing relieves the symptoms. He has tried nothing for the symptoms.    Past Medical History  Diagnosis Date  . Graves disease   . Arthritis   . Hypertension   . A-fib    History reviewed. No pertinent past surgical history. History reviewed. No pertinent family history. History  Substance Use Topics  . Smoking status: Former Smoker    Quit date: 05/04/1947  . Smokeless tobacco: Not on file  . Alcohol Use: No    Review of Systems  Constitutional: Negative for fever and chills.  Respiratory: Negative for cough and shortness of breath.   Cardiovascular: Negative for chest pain and leg swelling.  Gastrointestinal: Negative for vomiting and abdominal pain.  All other systems reviewed and are negative.     Allergies  Codeine  Home Medications   Prior to Admission medications   Medication Sig Start Date End Date Taking? Authorizing Provider  aspirin 325 MG tablet Take 325 mg by mouth daily.   Yes Historical Provider, MD  azaTHIOprine (IMURAN) 50 MG tablet Take 150 mg by mouth daily.  09/09/13  Yes Historical  Provider, MD  Calcium Carbonate-Vitamin D (CALCIUM-VITAMIN D) 500-200 MG-UNIT per tablet Take 1 tablet by mouth 2 (two) times daily with a meal.   Yes Historical Provider, MD  mirtazapine (REMERON) 15 MG tablet Take 15 mg by mouth at bedtime.   Yes Historical Provider, MD   BP 128/47  Pulse 81  Temp(Src) 98.4 F (36.9 C) (Oral)  Resp 16  SpO2 96% Physical Exam  Nursing note and vitals reviewed. Constitutional: He is oriented to person, place, and time. He appears well-developed and well-nourished. No distress.  HENT:  Head: Normocephalic and atraumatic.  Mouth/Throat: No oropharyngeal exudate.  Eyes: EOM are normal. Pupils are equal, round, and reactive to light.  Neck: Normal range of motion. Neck supple.  Cardiovascular: Normal rate and regular rhythm.  Exam reveals no friction rub.   No murmur heard. Pulmonary/Chest: Effort normal and breath sounds normal. No respiratory distress. He has no wheezes. He has no rales.  Abdominal: He exhibits no distension. There is no tenderness. There is no rebound.  Musculoskeletal: Normal range of motion. He exhibits no edema.       Right elbow: He exhibits laceration (small skin tears laterally). He exhibits normal range of motion, no swelling, no effusion and no deformity. No tenderness found.       Cervical back: He exhibits no bony tenderness.       Thoracic back: He exhibits no bony tenderness.       Lumbar back: He exhibits no bony  tenderness.       Feet:  Neurological: He is alert and oriented to person, place, and time.  Skin: No rash noted. He is not diaphoretic.    ED Course  Procedures (including critical care time) Labs Review Labs Reviewed  CBC  BASIC METABOLIC PANEL  URINALYSIS, ROUTINE W REFLEX MICROSCOPIC  I-STAT TROPOININ, ED    Imaging Review Dg Chest 2 View  09/15/2014   CLINICAL DATA:  Status post fall history of hypertension and atrial fibrillation  EXAM: CHEST  2 VIEW  COMPARISON:  PA and lateral chest x-ray of  September 22, 2005  FINDINGS: The lungs are well-expanded and clear. There is no evidence of a pulmonary contusion or pneumothorax or pneumomediastinum. The cardiopericardial silhouette is mildly enlarged though stable. The pulmonary vascularity is not engorged. The patient has undergone previous median sternotomy. There is old rib deformity laterally on the left. The thoracic spine is largely obscured on the lateral images  IMPRESSION: There is no acute post traumatic abnormality of the thorax. There is COPD and mild cardiomegaly without evidence of CHF.   Electronically Signed   By: David  [place]   On: 09/15/2014 13:00   Dg Elbow Complete Right  09/15/2014   CLINICAL DATA:  Status post fall with skin tear over the right elbow  EXAM: RIGHT ELBOW - COMPLETE 3+ VIEW  COMPARISON:  None.  FINDINGS: The bones of the elbow are osteopenic. There is no acute fracture nor dislocation. There is no joint effusion. Minimal degenerative changes are present.  IMPRESSION: There is no acute bony abnormality of the right elbow.   Electronically Signed   By: David  [place]   On: 09/15/2014 13:01   Ct Head Wo Contrast  09/15/2014   CLINICAL DATA:  Status post fall with subsequent headache; history of muscular dystrophy and hypertension  EXAM: CT HEAD WITHOUT CONTRAST  TECHNIQUE: Contiguous axial images were obtained from the base of the skull through the vertex without intravenous contrast.  COMPARISON:  Noncontrast CT scan of the brain of March 21, 2008.  FINDINGS: There is a subdural hemorrhage over the right frontal convexity. It is nearly isodense with brain parenchyma. It measures just over 8 mm in maximal thickness. No acute hyperdense blood products are demonstrated within it. There is no hematocrit level. No similar findings are noted elsewhere nor is there evidence of acute hemorrhage elsewhere. No acute ischemic changes are demonstrated. There is mild diffuse cerebral and cerebellar atrophy. There is no shift of the  midline. There is subtle decreased density in the deep white matter of both cerebral hemispheres consistent with chronic small vessel ischemic change.  The observed paranasal sinuses and mastoid air cells are clear. There is no cephalohematoma.  IMPRESSION: 1. There is a subdural fluid collection consistent with a subacute subdural hemorrhage over the right frontal convexity. No definite acute blood products are demonstrated within it. There is no significant mass effect. 2. There is no evidence of hemorrhage elsewhere nor acute ischemic change. 3. There is mild age appropriate diffuse cerebral and cerebellar atrophy. 4. There is no acute skull fracture. 5. Critical Value/emergent results were called by telephone at the time of interpretation on 09/15/2014 at 11:27 am to Dr. Elwin Mocha , who verbally acknowledged these results.   Electronically Signed   By: David  [place]   On: 09/15/2014 11:28   Dg Foot Complete Right  09/15/2014   CLINICAL DATA:  Status post fall. With clinical history of dislocation of the fifth toe  EXAM: RIGHT FOOT COMPLETE - 3+ VIEW  COMPARISON:  Right ankle series dated August 29, 2009  FINDINGS: The bones of the foot are diffusely osteopenic. There is irregularity of the mineralization of the base of the proximal phalanx of the fifth toe consistent with a nondisplaced fracture. The fifth metatarsophalangeal joint is appropriately positioned. The interphalangeal joints and metatarsophalangeal joints elsewhere are grossly normal. The metatarsals are intact. The bones of the hindfoot exhibit no acute abnormalities. There is old deformity of the distal fibula consistent with previous fracture.  IMPRESSION: 1. There is an acute nondisplaced fracture involving the base of the proximal phalanx of the right fifth toe. No other phalangeal fracture is demonstrated. No fractures are demonstrated elsewhere. 2. There are mild age-appropriate degenerative changes and diffuse osteopenia. 3. There is  an old fracture of the distal fibular metadiaphysis which has healed with some deformity.   Electronically Signed   By: David  [place]   On: 09/15/2014 12:58     EKG Interpretation   Date/Time:  Tuesday September 15 2014 11:35:19 EDT Ventricular Rate:  79 PR Interval:    QRS Duration: 84 QT Interval:  432 QTC Calculation: 495 R Axis:   63 Text Interpretation:  Atrial fibrillation Abnormal T, consider ischemia,  diffuse leads Similar to prior Confirmed by Gwendolyn Grant  MD, Delyle Weider ([id number]) on  09/16/2014 9:51:15 AM     CRITICAL CARE Performed by: Dagmar Hait   Total critical care time: 30 minutes  Critical care time was exclusive of separately billable procedures and treating other patients.  Critical care was necessary to treat or prevent imminent or life-threatening deterioration.  Critical care was time spent personally by me on the following activities: development of treatment plan with patient and/or surrogate as well as nursing, discussions with consultants, evaluation of patient's response to treatment, examination of patient, obtaining history from patient or surrogate, ordering and performing treatments and interventions, ordering and review of laboratory studies, ordering and review of radiographic studies, pulse oximetry and re-evaluation of patient's condition.  MDM   Final diagnoses:  Subdural hematoma  Metatarsal fracture, right, closed, initial encounter  Falls, initial encounter    80 year old male here after 2 falls last night. He said he slipped on the floor twice once last night, once this morning. He had his head on a mattress on the way down, denies any loss of consciousness. He is on 325 aspirin daily. He stated his right fifth toe was pointed sideways and he reduced it back to normal. He also suffered a few skin tears on his right elbow. Denies any chest pain, shortness of breath, nausea, vomiting, diarrhea, feeling bad. Here vitals are stable. Lungs clear.  Belly benign. No head injury noted. No spinal tenderness. Skin tears on right elbow, no deformities, will x-ray. Right fifth toe neurovascular intact, ecchymosis at the MTP joint. We'll x-ray. Weight is 2 falls, will look for possible infectious or cardiac etiology causing the falls. Not a frequent faller. Head CT shows subacute subdural hematoma. I spoke with neurosurgery, states to stop his aspirin and this is likely chronic. Nothing to do emergently. Patient is easily ambulatory here. Family is here wanting help with home health. Case management consulted and will help. Patient's family is acquiring home health resources when I'm in the room. Patient also broke his right fifth metatarsal. It is nondisplaced. He wears braces for drop foot which are inflexible and should serve as well as a postop shoe and will not cause gait abnormalities.  I  advised to stop aspirin as recommended by neurosurgery to prevent further bleeding. I spoke with his PCP, Dr. Pete Glatter, who will help arrange followup. Stable for discharge.  I have reviewed all labs and imaging and considered them in my medical decision making.    Elwin Mocha, MD 09/16/14 [phone]

## 2014-09-15 NOTE — Discharge Instructions (Signed)
Please stop taking your aspirin. Please follow up with Dr. Pete Glatter in 2 days.  Metatarsal Fracture, Undisplaced A metatarsal fracture is a break in the bone(s) of the foot. These are the bones of the foot that connect your toes to the bones of the ankle. DIAGNOSIS  The diagnoses of these fractures are usually made with X-rays. If there are problems in the forefoot and x-rays are normal a later bone scan will usually make the diagnosis.  TREATMENT AND HOME CARE INSTRUCTIONS  Treatment may or may not include a cast or walking shoe. When casts are needed the use is usually for short periods of time so as not to slow down healing with muscle wasting (atrophy).  Activities should be stopped until further advised by your caregiver.  Wear shoes with adequate shock absorbing capabilities and stiff soles.  Alternative exercise may be undertaken while waiting for healing. These may include bicycling and swimming, or as your caregiver suggests.  It is important to keep all follow-up visits or specialty referrals. The failure to keep these appointments could result in improper bone healing and chronic pain or disability.  Warning: Do not drive a car or operate a motor vehicle until your caregiver specifically tells you it is safe to do so. IF YOU DO NOT HAVE A CAST OR SPLINT:  You may walk on your injured foot as tolerated or advised.  Do not put any weight on your injured foot for as long as directed by your caregiver. Slowly increase the amount of time you walk on the foot as the pain allows or as advised.  Use crutches until you can bear weight without pain. A gradual increase in weight bearing may help.  Apply ice to the injury for 15-20 minutes each hour while awake for the first 2 days. Put the ice in a plastic bag and place a towel between the bag of ice and your skin.  Only take over-the-counter or prescription medicines for pain, discomfort, or fever as directed by your caregiver. SEEK  IMMEDIATE MEDICAL CARE IF:   Your cast gets damaged or breaks.  You have continued severe pain or more swelling than you did before the cast was put on, or the pain is not controlled with medications.  Your skin or nails below the injury turn blue or grey, or feel cold or numb.  There is a bad smell, or new stains or pus-like (purulent) drainage coming from the cast. MAKE SURE YOU:   Understand these instructions.  Will watch your condition.  Will get help right away if you are not doing well or get worse. Document Released: 08/26/2002 Document Revised: 02/26/2012 Document Reviewed: 07/17/2008 [facility] Patient Information 2015 [place], [place]. This information is not intended to replace advice given to you by your health care provider. Make sure you discuss any questions you have with your health care provider.  Subdural Hematoma A subdural hematoma is a collection of blood between the brain and its tough outermost membrane covering (the dura). Blood clots that form in this area push down on the brain and cause irritation. A subdural hematoma may cause parts of the brain to stop working and eventually cause death.  CAUSES A subdural hematoma is caused by bleeding from a ruptured blood vessel (hemorrhage). The bleeding results from trauma to the head, such as from a fall or motor vehicle accident. There are two types of subdural hemorrhages:  Acute. This type develops shortly after a serious blow to the head and causes blood  to collect very quickly. If not diagnosed and treated promptly, severe brain injury or death can occur.  Chronic. This is when bleeding develops more slowly, over weeks or months. RISK FACTORS People at risk for subdural hematoma include older persons, infants, and alcoholics. SYMPTOMS An acute subdural hemorrhage develops over minutes to hours. Symptoms can include:  Temporary loss of consciousness.  Weakness of arms or legs on one side of the body.  Changes  in vision or speech.  A severe headache.  Seizures.  Nausea and vomiting.  Increased sleepiness. A chronic subdural hemorrhage develops over weeks to months. Symptoms may develop slowly and produce less noticeable problems or changes. Symptoms include:  A mild headache.  A change in personality.  Loss of balance or difficulty walking.  Weakness, numbness, or tingling in the arms or legs.  Nausea or vomiting.  Memory loss.  Double vision.  Increased sleepiness. DIAGNOSIS Your health care provider will perform a thorough physical and neurological exam. A CT scan or MRI may also be done. If there is blood on the scan, its color will help your health care provider determine how long the hemorrhage has been there. TREATMENT If the cause is an acute subdural hemorrhage, immediate treatment is needed. In many cases an emergency surgery is performed to drain accumulated blood or to remove the blood clot. Sometimes steroid or diuretic medicines or controlled breathing through a ventilator is needed to decrease pressure in the brain. This is especially true if there is any swelling of the brain. If the cause is a chronic subdural hemorrhage, treatment depends on a variety of factors. Sometimes no treatment is needed. If the subdural hematoma is small and causes minimal or no symptoms, you may be treated with bed rest, medicines, and observation. If the hemorrhage is large or if you have neurological symptoms, an emergency surgery is usually needed to remove the blood clot. People who develop a subdural hemorrhage are at risk of developing seizures, even after the subdural hematoma has been treated. You may be prescribed an anti-seizure (anticonvulsant) medicine for a year or longer. HOME CARE INSTRUCTIONS  Only take medicines as directed by your health care provider.  Rest if directed by your health care provider.  Keep all follow-up appointments with your health care provider.  If you  play a contact sport such as football, hockey or soccer and you experienced a significant head injury, allow enough time for healing (up to 15 days) before you start playing again. A repeated injury that occurs during this fragile repair period is likely to result in hemorrhage. This is called the second impact syndrome. SEEK IMMEDIATE MEDICAL CARE IF:  You fall or experience minor trauma to your head and you are taking blood thinners. If you are on any blood thinners even a very small injury can cause a subdural hematoma. You should not hesitate to seek medical attention regardless of how minor you think your symptoms are.  You experience a head injury and have:  Drowsiness or a decrease in alertness.  Confusion or forgetfulness.  Slurred speech.  Irrational or aggressive behavior.  Numbness or paralysis in any part of the body.  A feeling of being sick to your stomach (nauseous) or you throw up (vomit).  Difficulty walking or poor coordination.  Double vision.  Seizures.  A bleeding disorder.  A history of heavy alcohol use.  Clear fluid draining from your nose or ears.  Personality changes.  Difficulty thinking.  Worsening symptoms. MAKE SURE YOU:  Understand these instructions.  Will watch your condition.  Will get help right away if you are not doing well or get worse. FOR MORE INFORMATION National Institute of Neurological Disorders and Stroke: [place].co.ukwww.ninds.nih.gov American Association of Neurological Surgeons: www.neurosurgerytoday.org American Academy of Neurology (AAN): ComparePet.czwww.aan.com Brain Injury Association of America: www.biausa.org Document Released: 10/21/2004 Document Revised: 09/24/2013 Document Reviewed: 06/06/2013 [facility] Patient Information 2015 [place], [place]. This information is not intended to replace advice given to you by your health care provider. Make sure you discuss any questions you have with your health care provider.

## 2014-09-15 NOTE — ED Notes (Addendum)
Per EMS pt from home fell in the middle of the night and laid on the floor for several hours prior to requesting help. Pt refused transport this morning. Pt fell out of bed this morning when he slipped while attempting to dress himself. Skin tear to right elbow, pt states that right little toe was bent out of place, states he reduced the joint himself. Toe appears in proper alignment at this time. Pt denies any new symptoms, pt has Hx of muscular dystrophy and has ongoing issues with weakness. Pt's daughter en route, believe patient may need to placement.

## 2014-09-15 NOTE — ED Notes (Signed)
Ortho called 

## 2014-09-15 NOTE — ED Notes (Signed)
Patient transported to CT 

## 2014-09-15 NOTE — ED Notes (Signed)
MD aware patient able to ambulate with walker with steady gait. No adverse events, SOB noted.

## 2014-09-15 NOTE — ED Notes (Signed)
Pt has returned back from radiology.

## 2014-09-25 ENCOUNTER — Encounter: Payer: Self-pay | Admitting: *Deleted

## 2014-11-16 ENCOUNTER — Ambulatory Visit: Payer: Medicare Other | Admitting: Podiatry

## 2014-12-18 DEATH — deceased

## 2016-01-15 IMAGING — CR DG FOOT COMPLETE 3+V*R*
3 series · 3 of 3 positions shown · non-contrast
Comparison: Right ankle series dated August 29, 2009

CLINICAL DATA: Status post fall. With clinical history of
dislocation of the fifth toe

EXAM:
RIGHT FOOT COMPLETE - 3+ VIEW

[x foot lat right]
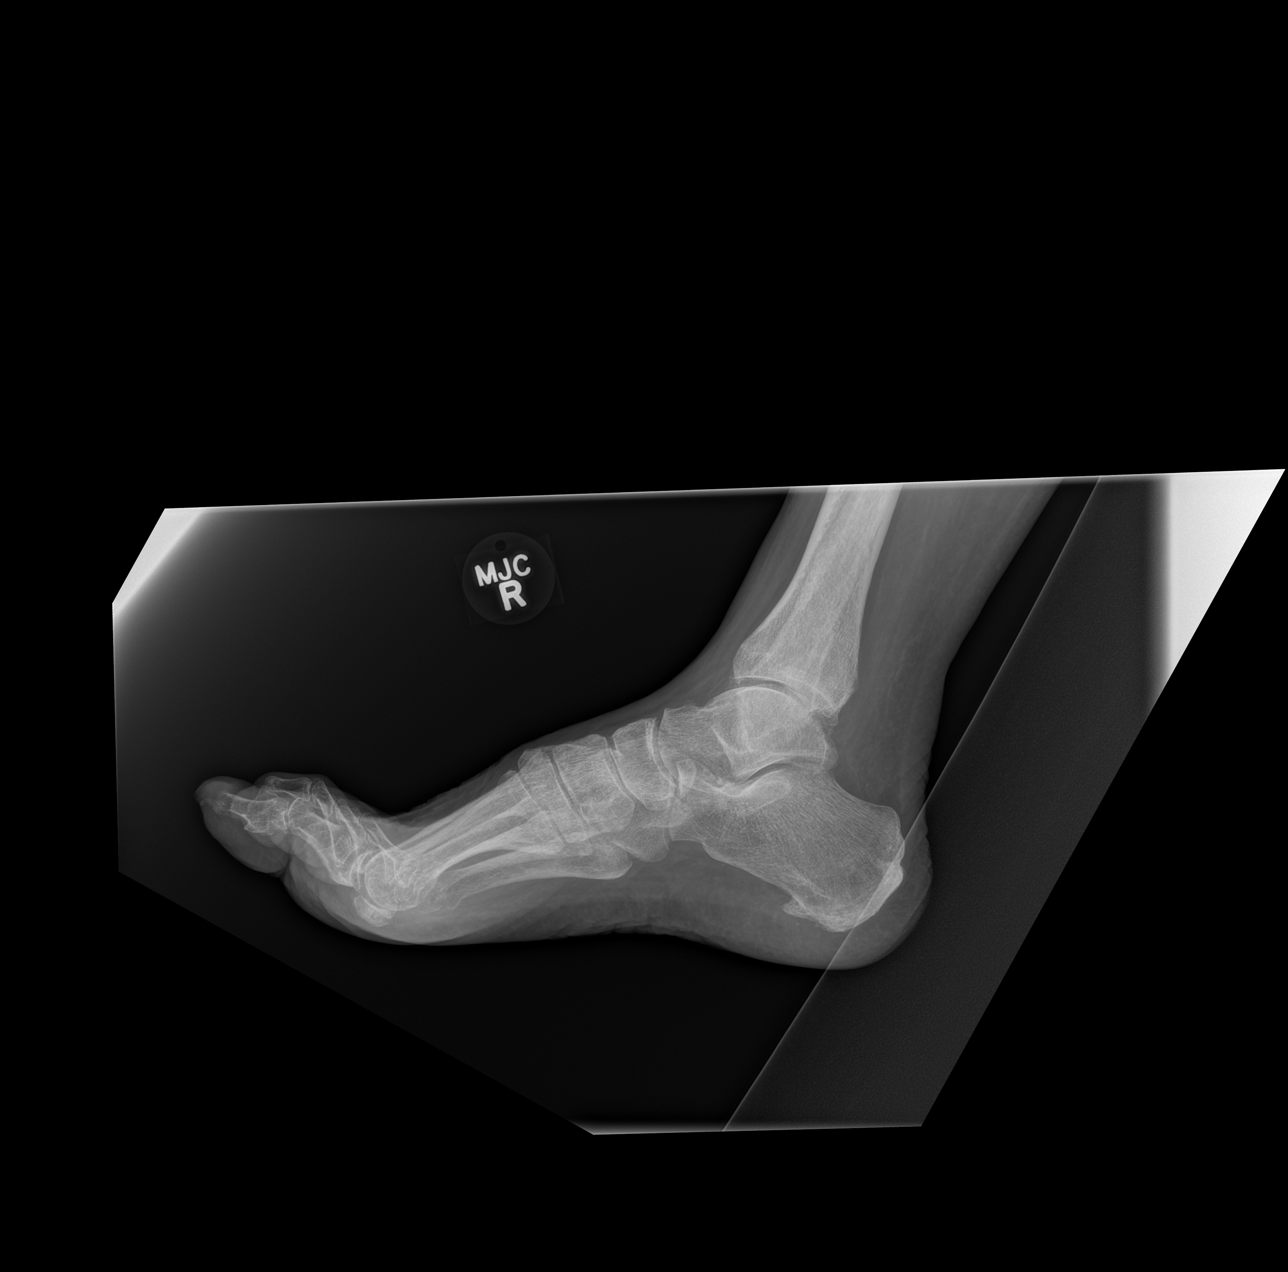

[x foot ap right]
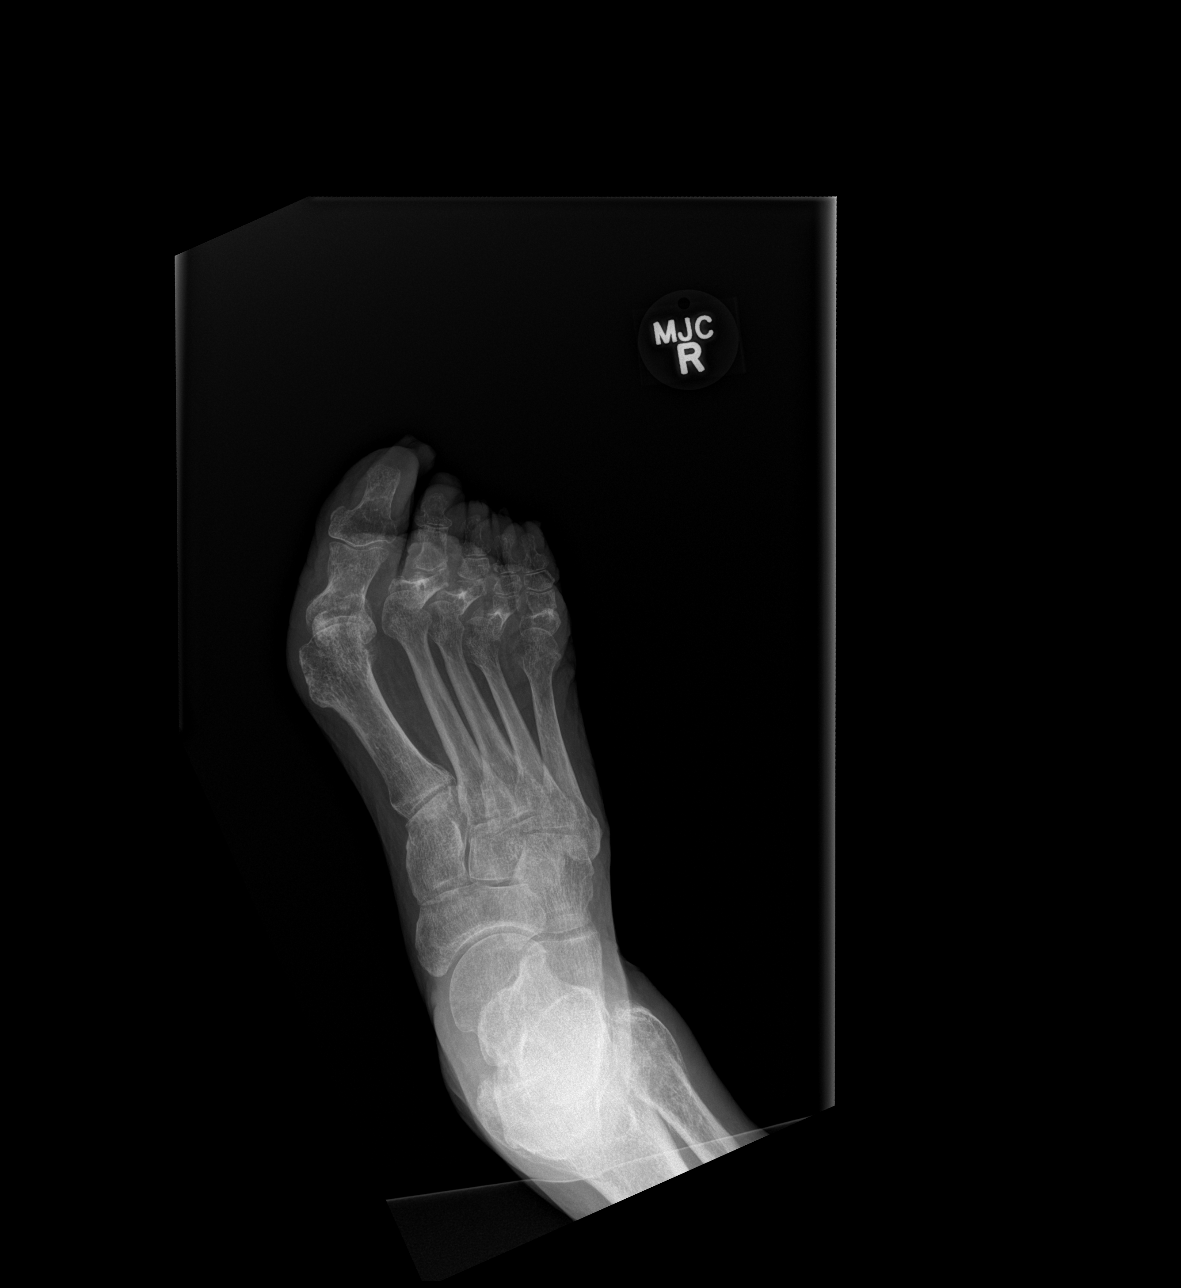

[x foot obl right]
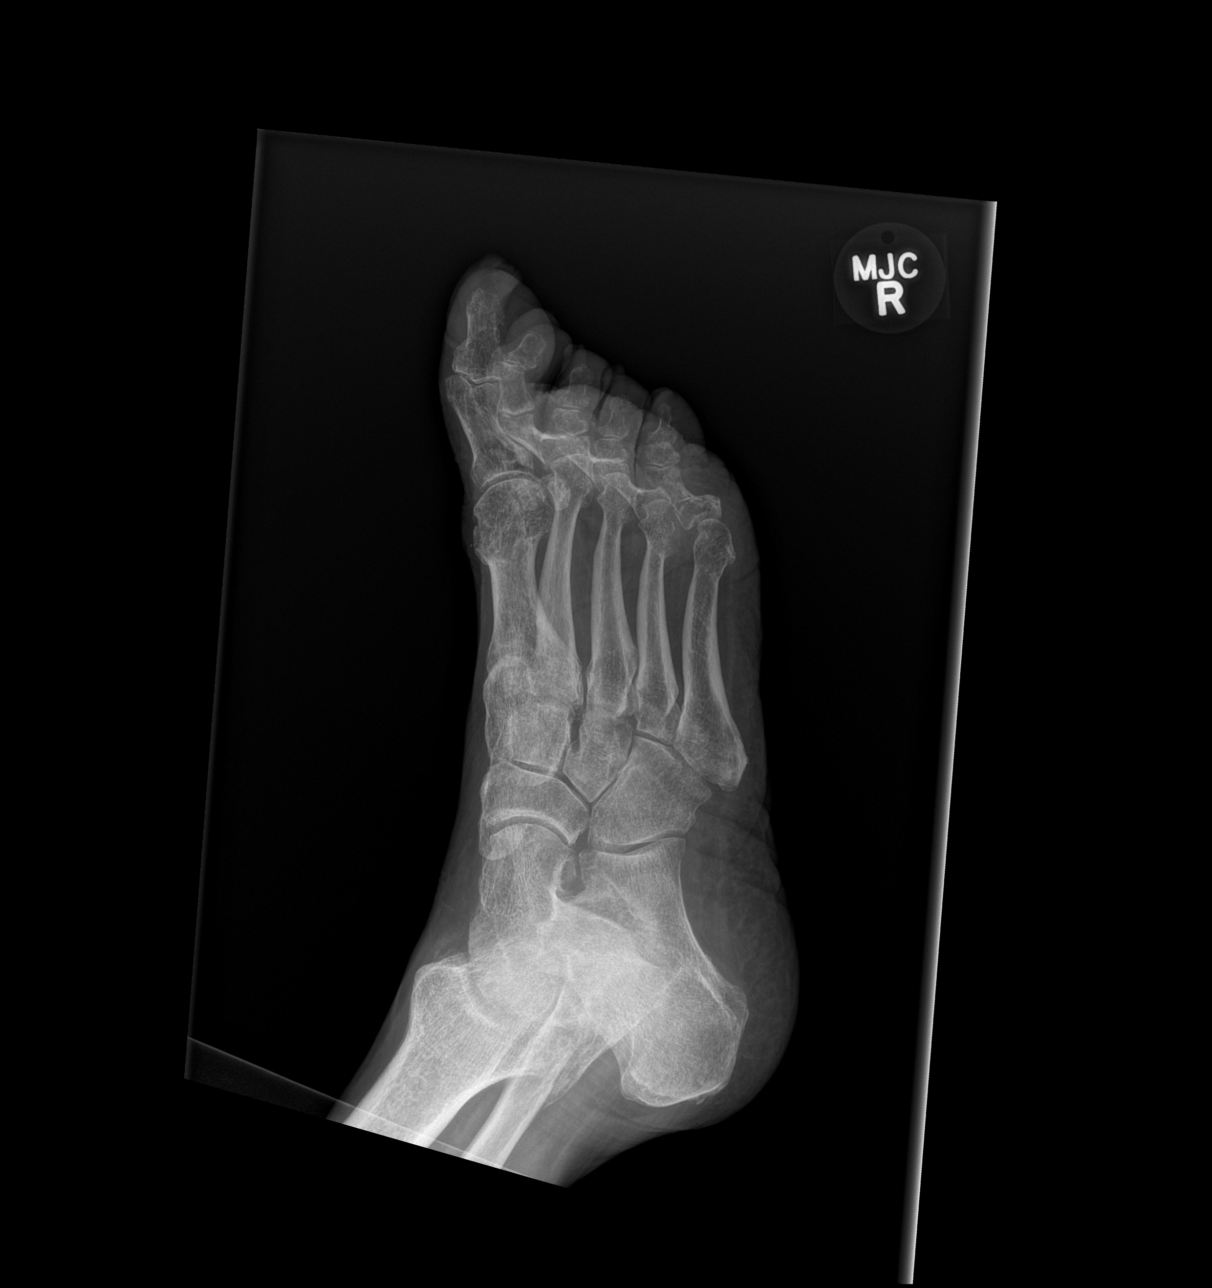

[3 of 3 positions shown; findings below may reference images not displayed]

FINDINGS: The bones of the foot are diffusely osteopenic. There is
irregularity of the mineralization of the base of the proximal
phalanx of the fifth toe consistent with a nondisplaced fracture.
The fifth metatarsophalangeal joint is appropriately positioned. The
interphalangeal joints and metatarsophalangeal joints elsewhere are
grossly normal. The metatarsals are intact. The bones of the
hindfoot exhibit no acute abnormalities. There is old deformity of
the distal fibula consistent with previous fracture.
IMPRESSION: 1. There is an acute nondisplaced fracture involving the base of the
proximal phalanx of the right fifth toe. No other phalangeal
fracture is demonstrated. No fractures are demonstrated elsewhere.
2. There are mild age-appropriate degenerative changes and diffuse
osteopenia.
3. There is an old fracture of the distal fibular metadiaphysis
which has healed with some deformity.

## 2016-01-15 IMAGING — CT CT HEAD W/O CM
2 series · 16 of 30 positions shown, 19 images · non-contrast
Comparison: Noncontrast CT scan of the brain March 21, 2008.

CLINICAL DATA: Status post fall with subsequent headache; history
of muscular dystrophy and hypertension

EXAM:
CT HEAD WITHOUT CONTRAST
TECHNIQUE: Contiguous axial images were obtained from the base of the skull
through the vertex without intravenous contrast.

[Series 2: head w/o · axial · non-contrast · 0.45mm/px · z∈[-102,+13]mm · 9 of 29 slices shown, 12 images]
[im 3/29  brain]
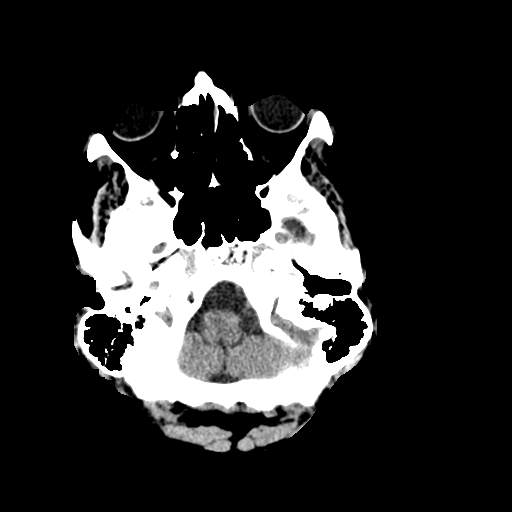
[im 3/29  bone]
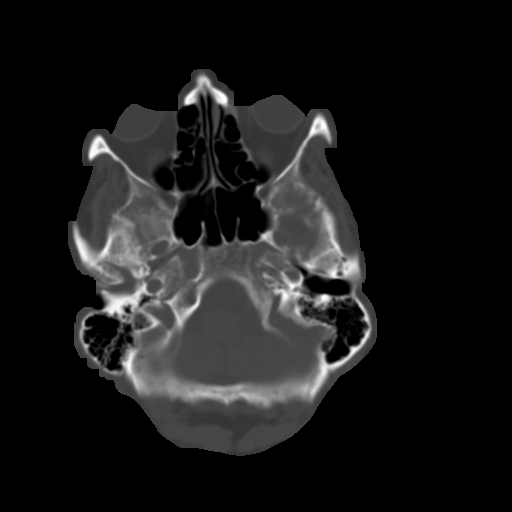
[im 6/29  brain]
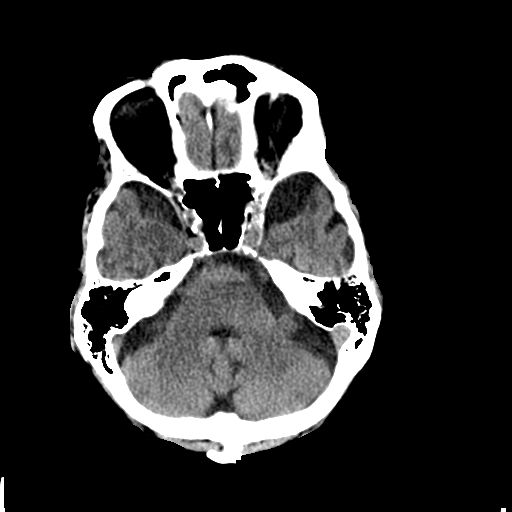
[im 9/29  brain]
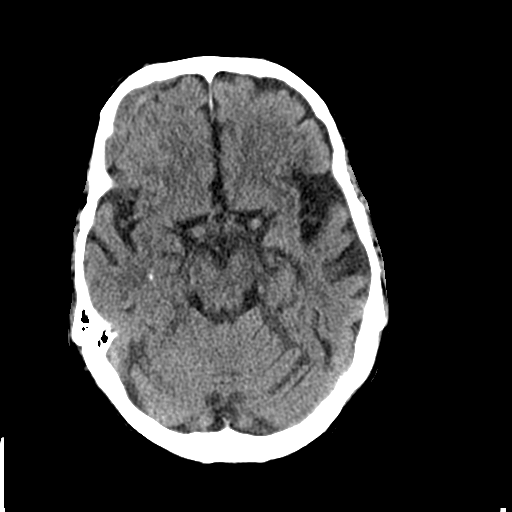
[im 12/29  brain]
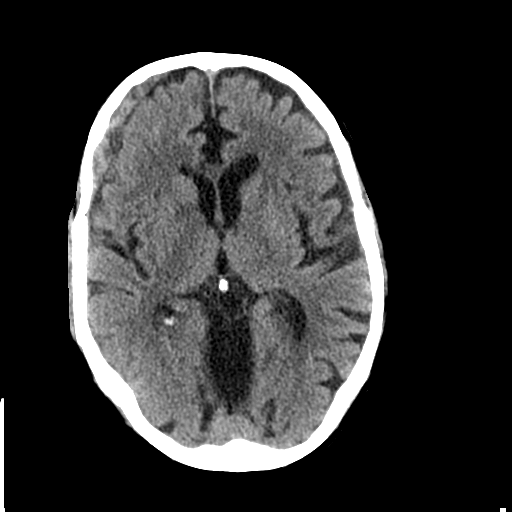
[im 15/29  brain]
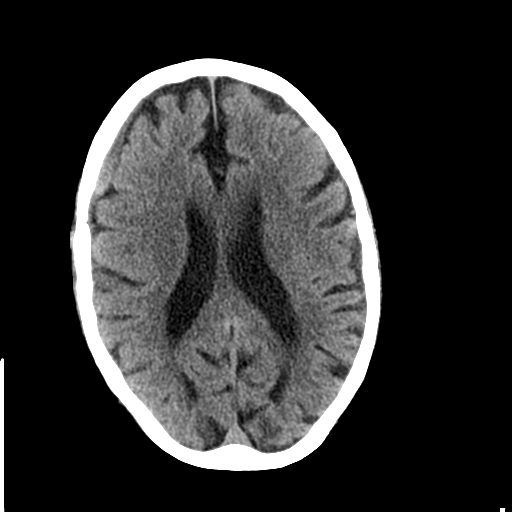
[im 15/29  bone]
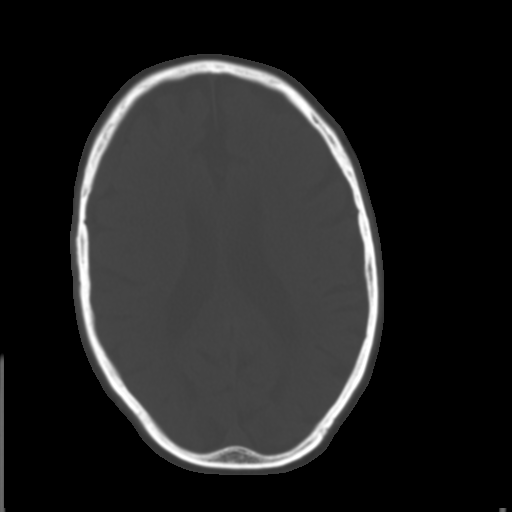
[im 17/29  brain]
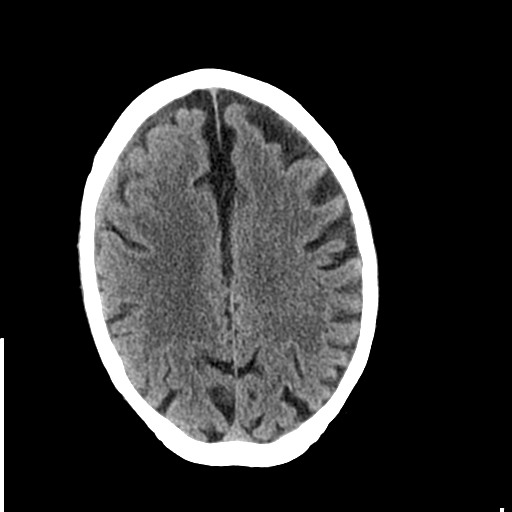
[im 20/29  brain]
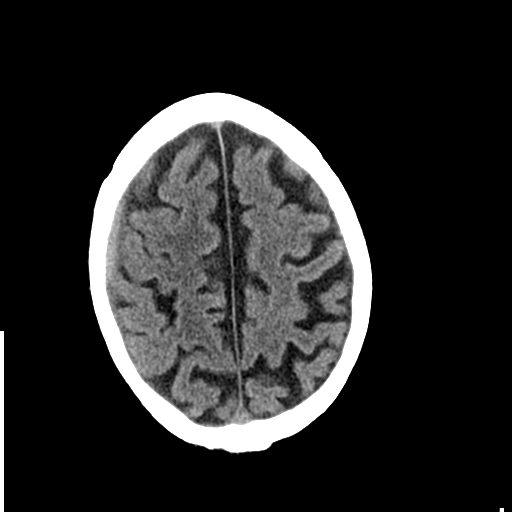
[im 23/29  brain]
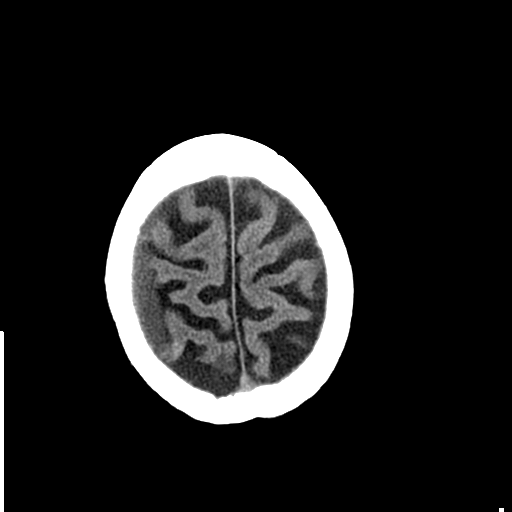
[im 26/29  brain]
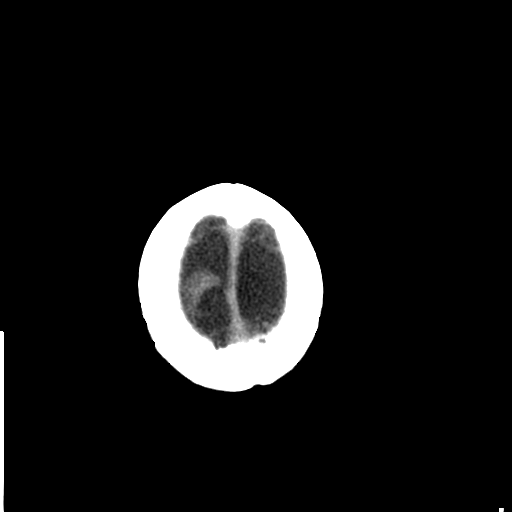
[im 26/29  bone]
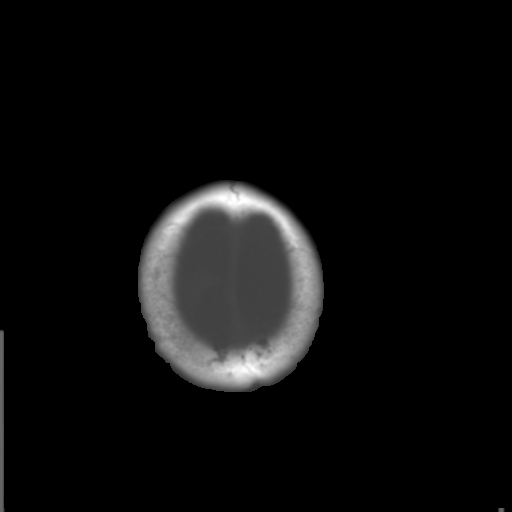

[Series 3: bone windows · axial · 0.45mm/px · z∈[-97,-4]mm · 7 of 48 slices shown]
[im 6/48  bone]
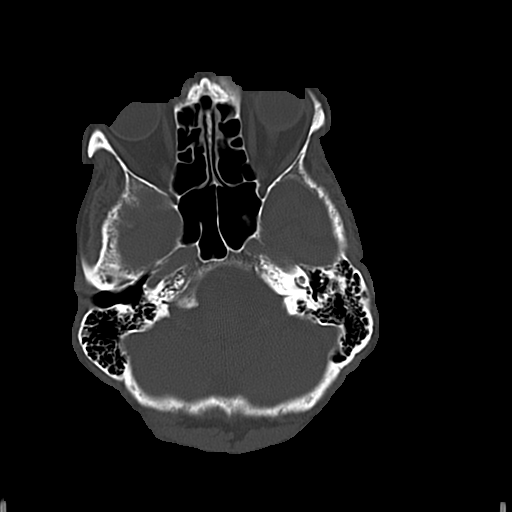
[im 11/48  bone]
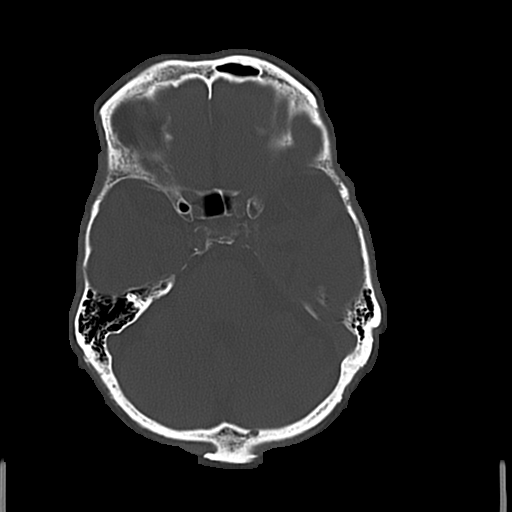
[im 16/48  bone]
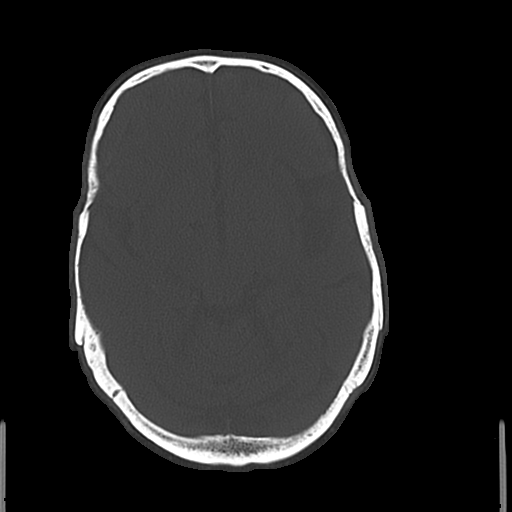
[im 21/48  bone]
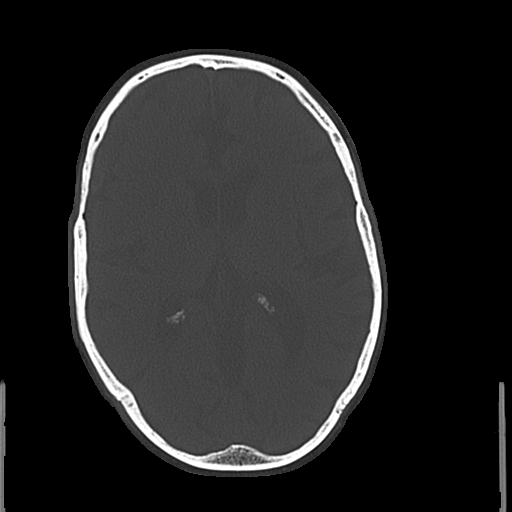
[im 27/48  bone]
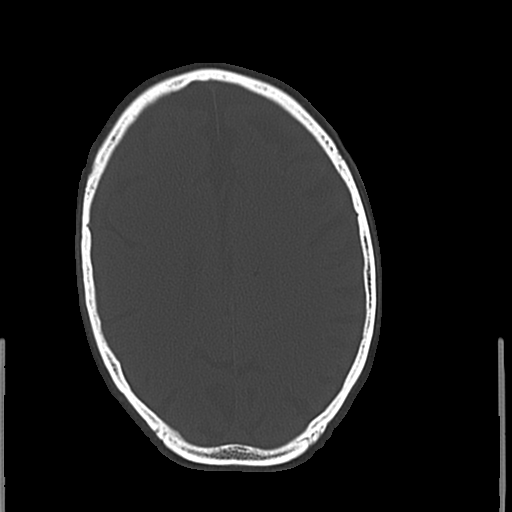
[im 32/48  bone]
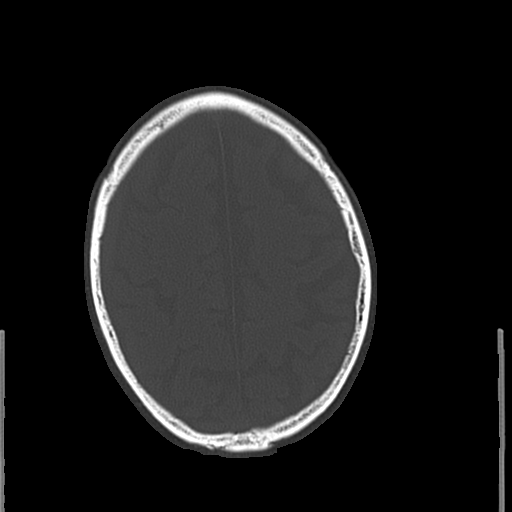
[im 37/48  bone]
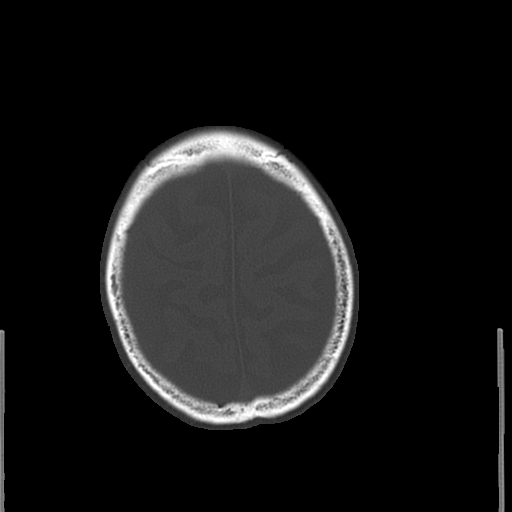

[16 of 30 positions shown; findings below may reference images not displayed]

FINDINGS: There is a subdural hemorrhage over the right frontal convexity. It
is nearly isodense with brain parenchyma. It measures just over 8 mm
in maximal thickness. No acute hyperdense blood products are
demonstrated within it. There is no hematocrit level. No similar
findings are noted elsewhere nor is there evidence of acute
hemorrhage elsewhere. No acute ischemic changes are demonstrated.
There is mild diffuse cerebral and cerebellar atrophy. There is no
shift of the midline. There is subtle decreased density in the deep
white matter of both cerebral hemispheres consistent with chronic
small vessel ischemic change.

The observed paranasal sinuses and mastoid air cells are clear.
There is no cephalohematoma.
IMPRESSION: 1. There is a subdural fluid collection consistent with a subacute
subdural hemorrhage over the right frontal convexity. No definite
acute blood products are demonstrated within it. There is no
significant mass effect.
2. There is no evidence of hemorrhage elsewhere nor acute ischemic
change.
3. There is mild age appropriate diffuse cerebral and cerebellar
atrophy.
4. There is no acute skull fracture.
5. Critical Value/emergent results were called by telephone at the
time of interpretation on 09/15/2014 at [DATE] to Dr. BIEN RONDEAU
, who verbally acknowledged these results.
